# Patient Record
Sex: Male | Born: 1960 | Race: Black or African American | Hispanic: No | Marital: Single | Smoking: Current every day smoker
Health system: Southern US, Community
[De-identification: ages and names within clinical notes are randomized; demographics above are authoritative.]

## PROBLEM LIST (undated history)

## (undated) DIAGNOSIS — M5430 Sciatica, unspecified side: Secondary | ICD-10-CM

## (undated) SURGERY — INCISION AND DRAINAGE, ABSCESS
Anesthesia: Choice | Laterality: Right

---

## 2001-09-15 ENCOUNTER — Emergency Department (HOSPITAL_COMMUNITY): Admission: EM | Admit: 2001-09-15 | Discharge: 2001-09-15 | Payer: Self-pay

## 2001-09-15 ENCOUNTER — Encounter: Payer: Self-pay | Admitting: Emergency Medicine

## 2002-06-28 ENCOUNTER — Emergency Department (HOSPITAL_COMMUNITY): Admission: EM | Admit: 2002-06-28 | Discharge: 2002-06-28 | Payer: Self-pay

## 2010-01-18 ENCOUNTER — Emergency Department (HOSPITAL_COMMUNITY): Admission: EM | Admit: 2010-01-18 | Discharge: 2010-01-18 | Payer: Self-pay | Admitting: Emergency Medicine

## 2010-05-05 ENCOUNTER — Emergency Department (HOSPITAL_COMMUNITY)
Admission: EM | Admit: 2010-05-05 | Discharge: 2010-05-05 | Disposition: A | Discharge status: Home / Self Care | Payer: Self-pay | Attending: Emergency Medicine | Admitting: Emergency Medicine

## 2010-05-05 DIAGNOSIS — F319 Bipolar disorder, unspecified: Secondary | ICD-10-CM | POA: Insufficient documentation

## 2010-05-05 DIAGNOSIS — M549 Dorsalgia, unspecified: Secondary | ICD-10-CM | POA: Insufficient documentation

## 2010-05-05 DIAGNOSIS — I10 Essential (primary) hypertension: Secondary | ICD-10-CM | POA: Insufficient documentation

## 2010-05-05 DIAGNOSIS — M5430 Sciatica, unspecified side: Secondary | ICD-10-CM | POA: Insufficient documentation

## 2016-07-10 ENCOUNTER — Emergency Department (HOSPITAL_COMMUNITY)
Admission: EM | Admit: 2016-07-10 | Discharge: 2016-07-10 | Disposition: A | Discharge status: Home / Self Care | Payer: Self-pay | Attending: Emergency Medicine | Admitting: Emergency Medicine

## 2016-07-10 ENCOUNTER — Encounter (HOSPITAL_COMMUNITY): Payer: Self-pay | Admitting: Emergency Medicine

## 2016-07-10 ENCOUNTER — Emergency Department (HOSPITAL_COMMUNITY): Payer: Self-pay

## 2016-07-10 DIAGNOSIS — R05 Cough: Secondary | ICD-10-CM | POA: Insufficient documentation

## 2016-07-10 DIAGNOSIS — F17210 Nicotine dependence, cigarettes, uncomplicated: Secondary | ICD-10-CM | POA: Insufficient documentation

## 2016-07-10 DIAGNOSIS — R059 Cough, unspecified: Secondary | ICD-10-CM

## 2016-07-10 DIAGNOSIS — R0789 Other chest pain: Secondary | ICD-10-CM | POA: Insufficient documentation

## 2016-07-10 LAB — BASIC METABOLIC PANEL
Anion gap: 8 (ref 5–15)
BUN: 19 mg/dL (ref 6–20)
CO2: 25 mmol/L (ref 22–32)
Calcium: 9.1 mg/dL (ref 8.9–10.3)
Chloride: 104 mmol/L (ref 101–111)
Creatinine, Ser: 1.19 mg/dL (ref 0.61–1.24)
GFR calc non Af Amer: >60 mL/min (ref >60)
Glucose, Bld: 96 mg/dL (ref 65–99)
Potassium: 4 mmol/L (ref 3.5–5.1)
SODIUM: 137 mmol/L (ref 135–145)

## 2016-07-10 LAB — CBC
HCT: 45.8 % (ref 39.0–52.0)
Hemoglobin: 15.8 g/dL (ref 13.0–17.0)
MCH: 31 pg (ref 26.0–34.0)
MCHC: 34.5 g/dL (ref 30.0–36.0)
MCV: 89.8 fL (ref 78.0–100.0)
PLATELETS: 276 10*3/uL (ref 150–400)
RBC: 5.1 MIL/uL (ref 4.22–5.81)
RDW: 13.3 % (ref 11.5–15.5)
WBC: 6.6 10*3/uL (ref 4.0–10.5)

## 2016-07-10 LAB — I-STAT TROPONIN, ED: TROPONIN I, POC: 0 ng/mL (ref 0.00–0.08)

## 2016-07-10 MED ORDER — PREDNISONE 20 MG PO TABS: 40.0000 mg | ORAL_TABLET | Freq: Every day | ORAL | 0 refills | Status: DC

## 2016-07-10 MED ORDER — PREDNISONE 20 MG PO TABS
60.0000 mg | ORAL_TABLET | Freq: Once | ORAL | Status: AC
  Administered 2016-07-10: 60 mg via ORAL
  Filled 2016-07-10: qty 3

## 2016-07-10 MED ORDER — HYDROCOD POLST-CPM POLST ER 10-8 MG/5ML PO SUER
5.0000 mL | Freq: Once | ORAL | Status: AC
  Administered 2016-07-10: 5 mL via ORAL
  Filled 2016-07-10: qty 5

## 2016-07-10 MED ORDER — ALBUTEROL SULFATE HFA 108 (90 BASE) MCG/ACT IN AERS
2.0000 | INHALATION_SPRAY | Freq: Once | RESPIRATORY_TRACT | Status: AC
  Administered 2016-07-10: 2 via RESPIRATORY_TRACT
  Filled 2016-07-10: qty 6.7

## 2016-07-10 MED ORDER — GUAIFENESIN 100 MG/5ML PO LIQD: 200.0000 mg | Freq: Three times a day (TID) | ORAL | 0 refills | Status: DC | PRN

## 2016-07-10 NOTE — Discharge Instructions (Signed)
As discussed, your evaluation today has been largely reassuring.  But, it is important that you monitor your condition carefully, and do not hesitate to return to the ED if you develop new, or concerning changes in your condition. ? ?Otherwise, please follow-up with your physician for appropriate ongoing care. ? ?

## 2016-07-10 NOTE — ED Provider Notes (Signed)
[facility] Provider Note   CSN: [id number] Arrival date & time: 07/10/16  0915     History   Chief Complaint Chief Complaint  Patient presents with  . Chest Pain    HPI Charles Garza is a 56 y.o. male.  HPI Patient presents with concern of ongoing cough, chest pain. Patient was well until about 2 months ago, now, over the past 2 months he has had persistent discomfort, diffuse across the anterior thorax. Pain is sore, worse with coughing, but is present all the time. No clear exertional or pleuritic component. No fever, no chills. No relief with OTC medication. Patient does smoke cigarettes, acknowledges concern for one cancer, as he has multiple family members with this in the history.  History reviewed. No pertinent past medical history.  There are no active problems to display for this patient.   History reviewed. No pertinent surgical history.     Home Medications    Prior to Admission medications   Not on File    Family History No family history on file.  Social History Social History  Substance Use Topics  . Smoking status: Current Every Day Smoker  . Smokeless tobacco: Current User  . Alcohol use No     Allergies   Penicillins   Review of Systems Review of Systems  Constitutional:       Per HPI, otherwise negative  HENT:       Per HPI, otherwise negative  Respiratory:       Per HPI, otherwise negative  Cardiovascular:       Per HPI, otherwise negative  Gastrointestinal: Negative for vomiting.  Endocrine:       Negative aside from HPI  Genitourinary:       Neg aside from HPI   Musculoskeletal:       Per HPI, otherwise negative  Skin: Negative.   Neurological: Negative for syncope.     Physical Exam Updated Vital Signs BP 133/86   Pulse 72   Temp 97.9 F (36.6 C) (Oral)   Resp 17   Ht  (1.676 m)   Wt 156 lb (70.8 kg)   SpO2 100%   BMI 25.18 kg/m   Physical Exam  Constitutional: He is oriented to person,  place, and time. He appears well-developed. No distress.  HENT:  Head: Normocephalic and atraumatic.  Eyes: Conjunctivae and EOM are normal.  Cardiovascular: Normal rate and regular rhythm.   Pulmonary/Chest: Effort normal. No stridor. No respiratory distress.  Abdominal: He exhibits no distension.  Musculoskeletal: He exhibits no edema.  Neurological: He is alert and oriented to person, place, and time.  Skin: Skin is warm and dry.  Psychiatric: He has a normal mood and affect.  Nursing note and vitals reviewed.    ED Treatments / Results  Labs (all labs ordered are listed, but only abnormal results are displayed) Labs Reviewed  BASIC METABOLIC PANEL  CBC  I-STAT TROPOININ, ED    EKG  EKG Interpretation  Date/Time:  Sunday July 10 2016 09:20:12 EDT Ventricular Rate:  85 PR Interval:  142 QRS Duration: 70 QT Interval:  350 QTC Calculation: 416 R Axis:   70 Text Interpretation:  Normal sinus rhythm Right atrial enlargement q waves anteriorly Abnormal ekg Confirmed by Gerhard Munch  MD [phone]) on 07/10/2016 9:40:24 AM Also confirmed by Gerhard Munch  MD ([id number]), editor Misty Stanley [phone])  on 07/10/2016 9:49:34 AM       Radiology Dg Chest 2 View  Result Date: 07/10/2016  CLINICAL DATA:  Chest pain and shortness of breath for 3 months. EXAM: CHEST  2 VIEW COMPARISON:  None. FINDINGS: The cardiomediastinal silhouette is unremarkable. There is no evidence of focal airspace disease, pulmonary edema, suspicious pulmonary nodule/mass, pleural effusion, or pneumothorax. No acute bony abnormalities are identified. IMPRESSION: No active cardiopulmonary disease. Electronically Signed   By: Harmon Pier M.D.   On: 07/10/2016 10:50    Procedures Procedures (including critical care time)  Medications Ordered in ED Medications  chlorpheniramine-HYDROcodone (TUSSIONEX) 10-8 MG/5ML suspension 5 mL (5 mLs Oral Given 07/10/16 1056)     Initial Impression / Assessment and  Plan / ED Course  I have reviewed the triage vital signs and the nursing notes.  Pertinent labs & imaging results that were available during my care of the patient were reviewed by me and considered in my medical decision making (see chart for details).  11:02 AM On repeat exam the patient is awake and alert, in no distress per We discussed all findings with reassuring x-ray. We discussed the importance of smoking cessation, particularly given the patient's presentation today, and his family history of lung cancer. Patient is amenable to pursuing this With no evidence for pneumonia, both concern for ongoing cough course of 2 months, the patient will be started on a short course of steroids, albuterol inhaler antitussive medication  Final Clinical Impressions(s) / ED Diagnoses  Cough Chest pain   Gerhard Munch, MD 07/10/16 1103

## 2016-07-10 NOTE — ED Triage Notes (Signed)
Pt. Stated, I've had chest pain for a month, denies any other symptoms.

## 2016-07-24 ENCOUNTER — Emergency Department (HOSPITAL_COMMUNITY): Payer: Self-pay

## 2016-07-24 ENCOUNTER — Emergency Department (HOSPITAL_COMMUNITY)
Admission: EM | Admit: 2016-07-24 | Discharge: 2016-07-24 | Disposition: A | Discharge status: Home / Self Care | Payer: Self-pay | Attending: Emergency Medicine | Admitting: Emergency Medicine

## 2016-07-24 DIAGNOSIS — R0602 Shortness of breath: Secondary | ICD-10-CM

## 2016-07-24 DIAGNOSIS — Z79899 Other long term (current) drug therapy: Secondary | ICD-10-CM | POA: Insufficient documentation

## 2016-07-24 DIAGNOSIS — J40 Bronchitis, not specified as acute or chronic: Secondary | ICD-10-CM | POA: Insufficient documentation

## 2016-07-24 DIAGNOSIS — F17200 Nicotine dependence, unspecified, uncomplicated: Secondary | ICD-10-CM | POA: Insufficient documentation

## 2016-07-24 MED ORDER — ALBUTEROL SULFATE HFA 108 (90 BASE) MCG/ACT IN AERS
2.0000 | INHALATION_SPRAY | RESPIRATORY_TRACT | Status: DC
  Administered 2016-07-24: 2 via RESPIRATORY_TRACT
  Filled 2016-07-24: qty 6.7

## 2016-07-24 MED ORDER — ALBUTEROL (5 MG/ML) CONTINUOUS INHALATION SOLN
10.0000 mg/h | INHALATION_SOLUTION | Freq: Once | RESPIRATORY_TRACT | Status: AC
  Administered 2016-07-24: 10 mg/h via RESPIRATORY_TRACT
  Filled 2016-07-24: qty 20

## 2016-07-24 MED ORDER — PREDNISONE 20 MG PO TABS
60.0000 mg | ORAL_TABLET | Freq: Once | ORAL | Status: AC
  Administered 2016-07-24: 60 mg via ORAL
  Filled 2016-07-24: qty 3

## 2016-07-24 MED ORDER — PREDNISONE 10 MG (21) PO TBPK
ORAL_TABLET | Freq: Every day | ORAL | 0 refills | Status: DC
Start: 2016-07-24 — End: 2016-10-23

## 2016-07-24 NOTE — ED Provider Notes (Signed)
[facility] Provider Note   CSN: [id number] Arrival date & time: 07/24/16  1134     History   Chief Complaint Chief Complaint  Patient presents with  . Cough  . Bronchitis    HPI Charles Garza is a 56 y.o. male.  56 year old male with history of cough times several months presents with increasing nonproductive cough with some dyspnea. No fever or chills. Was diagnosed with bronchitis recently and placed on 4 days of prednisone which is completed. Denies any vomiting or diarrhea. He does use tobacco products as well as has seasonal allergies as well as works with chemicals. Symptoms seem to wax and wane and nothing makes them better or worse. He also has an albuterol inhaler which offers limited relief      No past medical history on file.  There are no active problems to display for this patient.   No past surgical history on file.     Home Medications    Prior to Admission medications   Medication Sig Start Date End Date Taking? Authorizing Provider  guaiFENesin (TUSSIN) 100 MG/5ML liquid Take 10 mLs (200 mg total) by mouth 3 (three) times daily as needed for cough. 07/10/16   Gerhard Munch, MD  predniSONE (DELTASONE) 20 MG tablet Take 2 tablets (40 mg total) by mouth daily with breakfast. For the next four days 07/10/16   Gerhard Munch, MD    Family History No family history on file.  Social History Social History  Substance Use Topics  . Smoking status: Current Every Day Smoker  . Smokeless tobacco: Current User  . Alcohol use No     Allergies   Penicillins   Review of Systems Review of Systems  All other systems reviewed and are negative.    Physical Exam Updated Vital Signs BP 121/82 (BP Location: Left Arm)   Pulse 96   Temp 98.1 F (36.7 C) (Oral)   Resp 16   Ht  (1.676 m)   Wt 70.8 kg   SpO2 98%   BMI 25.18 kg/m   Physical Exam  Constitutional: He is oriented to person, place, and time. He appears well-developed and  well-nourished.  Non-toxic appearance. No distress.  HENT:  Head: Normocephalic and atraumatic.  Eyes: Conjunctivae, EOM and lids are normal. Pupils are equal, round, and reactive to light.  Neck: Normal range of motion. Neck supple. No tracheal deviation present. No thyroid mass present.  Cardiovascular: Normal rate, regular rhythm and normal heart sounds.  Exam reveals no gallop.   No murmur heard. Pulmonary/Chest: Effort normal. No stridor. No respiratory distress. He has decreased breath sounds. He has wheezes in the right lower field and the left lower field. He has no rhonchi. He has no rales.  Abdominal: Soft. Normal appearance and bowel sounds are normal. He exhibits no distension. There is no tenderness. There is no rebound and no CVA tenderness.  Musculoskeletal: Normal range of motion. He exhibits no edema or tenderness.  Neurological: He is alert and oriented to person, place, and time. He has normal strength. No cranial nerve deficit or sensory deficit. GCS eye subscore is 4. GCS verbal subscore is 5. GCS motor subscore is 6.  Skin: Skin is warm and dry. No abrasion and no rash noted.  Psychiatric: He has a normal mood and affect. His speech is normal and behavior is normal.  Nursing note and vitals reviewed.    ED Treatments / Results  Labs (all labs ordered are listed, but only abnormal results  are displayed) Labs Reviewed - No data to display  EKG  EKG Interpretation None       Radiology Dg Chest 2 View  Result Date: 07/24/2016 CLINICAL DATA:  CP x 1 month. No relevant surgical hx. Patient is a smoker. Has high BP but not on meds. EXAM: CHEST  2 VIEW COMPARISON:  07/10/2016 FINDINGS: The heart size and mediastinal contours are within normal limits. Lungs are mildly hyperexpanded but clear. No pleural effusion or pneumothorax. The visualized skeletal structures are unremarkable. IMPRESSION: No active cardiopulmonary disease. Electronically Signed   By: Amie Portland  M.D.   On: 07/24/2016 12:53    Procedures Procedures (including critical care time)  Medications Ordered in ED Medications  albuterol (PROVENTIL,VENTOLIN) solution continuous neb (not administered)  predniSONE (DELTASONE) tablet 60 mg (not administered)     Initial Impression / Assessment and Plan / ED Course  I have reviewed the triage vital signs and the nursing notes.  Pertinent labs & imaging results that were available during my care of the patient were reviewed by me and considered in my medical decision making (see chart for details).     Patient given albuterol continuous treatment along with prednisone. His lung exam is improved. Stable for discharge  Final Clinical Impressions(s) / ED Diagnoses   Final diagnoses:  SOB (shortness of breath)    New Prescriptions New Prescriptions   No medications on file     Lorre Nick, MD 07/24/16 1455

## 2016-07-24 NOTE — ED Triage Notes (Signed)
Pt. Stated, I was here 2 weeks ago with Bronchitis and I still have the bronchitis with a cough and its green and brown.  I took 4 days of an antibiotic.

## 2016-07-24 NOTE — ED Notes (Signed)
Pt states he was seen here a couple of weeks ago and was diagnosed with bronchitis.  Pt said he was prescribed cough meds and antibiotics but nothing has helped.

## 2016-07-24 NOTE — ED Notes (Signed)
Continuous neb complete.

## 2016-10-23 ENCOUNTER — Inpatient Hospital Stay (HOSPITAL_COMMUNITY)
Admission: EM | Admit: 2016-10-23 | Discharge: 2016-10-24 | DRG: 513 | Disposition: A | Discharge status: Home / Self Care | Payer: Self-pay | Attending: Family Medicine | Admitting: Family Medicine

## 2016-10-23 ENCOUNTER — Inpatient Hospital Stay: Admit: 2016-10-23 | Payer: Self-pay | Admitting: Orthopedic Surgery

## 2016-10-23 ENCOUNTER — Encounter (HOSPITAL_COMMUNITY): Payer: Self-pay | Admitting: Emergency Medicine

## 2016-10-23 ENCOUNTER — Inpatient Hospital Stay (HOSPITAL_COMMUNITY): Payer: Self-pay | Admitting: Anesthesiology

## 2016-10-23 ENCOUNTER — Emergency Department (HOSPITAL_COMMUNITY): Payer: Self-pay

## 2016-10-23 ENCOUNTER — Encounter (HOSPITAL_COMMUNITY)
Admission: EM | Disposition: A | Discharge status: Home / Self Care | Payer: Self-pay | Source: Home / Self Care | Attending: Family Medicine

## 2016-10-23 DIAGNOSIS — E860 Dehydration: Secondary | ICD-10-CM | POA: Diagnosis present

## 2016-10-23 DIAGNOSIS — L03119 Cellulitis of unspecified part of limb: Secondary | ICD-10-CM | POA: Diagnosis present

## 2016-10-23 DIAGNOSIS — Z720 Tobacco use: Secondary | ICD-10-CM

## 2016-10-23 DIAGNOSIS — L03113 Cellulitis of right upper limb: Secondary | ICD-10-CM | POA: Diagnosis present

## 2016-10-23 DIAGNOSIS — N179 Acute kidney failure, unspecified: Secondary | ICD-10-CM | POA: Diagnosis present

## 2016-10-23 DIAGNOSIS — M65041 Abscess of tendon sheath, right hand: Principal | ICD-10-CM | POA: Diagnosis present

## 2016-10-23 DIAGNOSIS — L02511 Cutaneous abscess of right hand: Secondary | ICD-10-CM

## 2016-10-23 DIAGNOSIS — N289 Disorder of kidney and ureter, unspecified: Secondary | ICD-10-CM

## 2016-10-23 HISTORY — PX: INCISION AND DRAINAGE ABSCESS: SHX5864

## 2016-10-23 LAB — SURGICAL PCR SCREEN
MRSA, PCR: NEGATIVE
STAPHYLOCOCCUS AUREUS: NEGATIVE

## 2016-10-23 LAB — CBC WITH DIFFERENTIAL/PLATELET
BASOS ABS: 0 10*3/uL (ref 0.0–0.1)
Basophils Relative: 0 %
Eosinophils Absolute: 0.5 10*3/uL (ref 0.0–0.7)
Eosinophils Relative: 4 %
HCT: 43.1 % (ref 39.0–52.0)
Hemoglobin: 15.1 g/dL (ref 13.0–17.0)
LYMPHS ABS: 2.2 10*3/uL (ref 0.7–4.0)
Lymphocytes Relative: 19 %
MCH: 31.8 pg (ref 26.0–34.0)
MCHC: 35 g/dL (ref 30.0–36.0)
MCV: 90.7 fL (ref 78.0–100.0)
MONO ABS: 1.1 10*3/uL — AB (ref 0.1–1.0)
Monocytes Relative: 9 %
Neutro Abs: 8 10*3/uL — ABNORMAL HIGH (ref 1.7–7.7)
Neutrophils Relative %: 68 %
PLATELETS: 253 10*3/uL (ref 150–400)
RBC: 4.75 MIL/uL (ref 4.22–5.81)
RDW: 12.9 % (ref 11.5–15.5)
WBC: 11.8 10*3/uL — AB (ref 4.0–10.5)

## 2016-10-23 LAB — BASIC METABOLIC PANEL
Anion gap: 8 (ref 5–15)
BUN: 11 mg/dL (ref 6–20)
CO2: 27 mmol/L (ref 22–32)
CREATININE: 1.29 mg/dL — AB (ref 0.61–1.24)
Calcium: 9.1 mg/dL (ref 8.9–10.3)
Chloride: 101 mmol/L (ref 101–111)
GFR calc Af Amer: >60 mL/min (ref >60)
Glucose, Bld: 107 mg/dL — ABNORMAL HIGH (ref 65–99)
Potassium: 3.9 mmol/L (ref 3.5–5.1)
SODIUM: 136 mmol/L (ref 135–145)

## 2016-10-23 LAB — LACTIC ACID, PLASMA: LACTIC ACID, VENOUS: 0.8 mmol/L (ref 0.5–1.9)

## 2016-10-23 LAB — GLUCOSE, CAPILLARY: GLUCOSE-CAPILLARY: 98 mg/dL (ref 65–99)

## 2016-10-23 LAB — HIV ANTIBODY (ROUTINE TESTING W REFLEX): HIV SCREEN 4TH GENERATION: NONREACTIVE

## 2016-10-23 SURGERY — INCISION AND DRAINAGE, ABSCESS
Anesthesia: General | Site: Arm Lower | Laterality: Right

## 2016-10-23 MED ORDER — LIDOCAINE HCL (CARDIAC) 20 MG/ML IV SOLN
INTRAVENOUS | Status: DC | PRN
  Administered 2016-10-23: 60 mg via INTRAVENOUS

## 2016-10-23 MED ORDER — MORPHINE SULFATE (PF) 4 MG/ML IV SOLN
2.0000 mg | INTRAVENOUS | Status: DC | PRN
  Administered 2016-10-23 – 2016-10-24 (×2): 4 mg via INTRAVENOUS
  Filled 2016-10-23 (×2): qty 1

## 2016-10-23 MED ORDER — FENTANYL CITRATE (PF) 100 MCG/2ML IJ SOLN
100.0000 ug | Freq: Once | INTRAMUSCULAR | Status: AC
  Administered 2016-10-23: 100 ug via INTRAVENOUS
  Filled 2016-10-23: qty 2

## 2016-10-23 MED ORDER — HYDROMORPHONE HCL 1 MG/ML IJ SOLN
INTRAMUSCULAR | Status: AC
  Administered 2016-10-23: 0.5 mg via INTRAVENOUS
  Filled 2016-10-23: qty 1

## 2016-10-23 MED ORDER — ACETAMINOPHEN 325 MG PO TABS: 650.0000 mg | ORAL_TABLET | Freq: Four times a day (QID) | ORAL | Status: DC | PRN

## 2016-10-23 MED ORDER — MIDAZOLAM HCL 2 MG/2ML IJ SOLN
INTRAMUSCULAR | Status: AC
  Filled 2016-10-23: qty 2

## 2016-10-23 MED ORDER — DOXYCYCLINE HYCLATE 100 MG PO TABS
100.0000 mg | ORAL_TABLET | Freq: Two times a day (BID) | ORAL | Status: DC
  Administered 2016-10-23 – 2016-10-24 (×3): 100 mg via ORAL
  Filled 2016-10-23 (×3): qty 1

## 2016-10-23 MED ORDER — HYDROCODONE-ACETAMINOPHEN 5-325 MG PO TABS
1.0000 | ORAL_TABLET | ORAL | Status: DC | PRN
  Administered 2016-10-23 (×2): 1 via ORAL
  Administered 2016-10-24 (×2): 2 via ORAL
  Filled 2016-10-23 (×2): qty 1
  Filled 2016-10-23 (×2): qty 2

## 2016-10-23 MED ORDER — FENTANYL CITRATE (PF) 100 MCG/2ML IJ SOLN
50.0000 ug | Freq: Once | INTRAMUSCULAR | Status: AC
  Administered 2016-10-23: 50 ug via INTRAVENOUS
  Filled 2016-10-23: qty 2

## 2016-10-23 MED ORDER — LACTATED RINGERS IV SOLN
INTRAVENOUS | Status: DC | PRN
  Administered 2016-10-23: 10:00:00 via INTRAVENOUS

## 2016-10-23 MED ORDER — BISACODYL 5 MG PO TBEC: 5.0000 mg | DELAYED_RELEASE_TABLET | Freq: Every day | ORAL | Status: DC | PRN

## 2016-10-23 MED ORDER — CHLORHEXIDINE GLUCONATE 4 % EX LIQD
60.0000 mL | Freq: Once | CUTANEOUS | Status: DC
  Administered 2016-10-23: 4 via TOPICAL

## 2016-10-23 MED ORDER — TETANUS-DIPHTH-ACELL PERTUSSIS 5-2.5-18.5 LF-MCG/0.5 IM SUSP
0.5000 mL | Freq: Once | INTRAMUSCULAR | Status: DC
  Filled 2016-10-23: qty 0.5

## 2016-10-23 MED ORDER — FENTANYL CITRATE (PF) 250 MCG/5ML IJ SOLN
INTRAMUSCULAR | Status: AC
  Filled 2016-10-23: qty 5

## 2016-10-23 MED ORDER — CLINDAMYCIN PHOSPHATE 600 MG/50ML IV SOLN
600.0000 mg | Freq: Once | INTRAVENOUS | Status: AC
  Administered 2016-10-23: 600 mg via INTRAVENOUS
  Filled 2016-10-23: qty 50

## 2016-10-23 MED ORDER — ACETAMINOPHEN 650 MG RE SUPP: 650.0000 mg | Freq: Four times a day (QID) | RECTAL | Status: DC | PRN

## 2016-10-23 MED ORDER — BUPIVACAINE HCL (PF) 0.25 % IJ SOLN
INTRAMUSCULAR | Status: AC
  Filled 2016-10-23: qty 30

## 2016-10-23 MED ORDER — SODIUM CHLORIDE 0.9 % IR SOLN
Status: DC | PRN
  Administered 2016-10-23: 1000 mL

## 2016-10-23 MED ORDER — PROPOFOL 10 MG/ML IV BOLUS
INTRAVENOUS | Status: DC | PRN
  Administered 2016-10-23: 150 mg via INTRAVENOUS

## 2016-10-23 MED ORDER — SUCCINYLCHOLINE CHLORIDE 200 MG/10ML IV SOSY
PREFILLED_SYRINGE | INTRAVENOUS | Status: AC
  Filled 2016-10-23: qty 10

## 2016-10-23 MED ORDER — SENNOSIDES-DOCUSATE SODIUM 8.6-50 MG PO TABS: 1.0000 | ORAL_TABLET | Freq: Every evening | ORAL | Status: DC | PRN

## 2016-10-23 MED ORDER — CLINDAMYCIN PHOSPHATE 600 MG/50ML IV SOLN: 600.0000 mg | Freq: Three times a day (TID) | INTRAVENOUS | Status: DC

## 2016-10-23 MED ORDER — MIDAZOLAM HCL 5 MG/5ML IJ SOLN
INTRAMUSCULAR | Status: DC | PRN
  Administered 2016-10-23: 2 mg via INTRAVENOUS

## 2016-10-23 MED ORDER — VANCOMYCIN HCL 10 G IV SOLR
1500.0000 mg | Freq: Once | INTRAVENOUS | Status: AC
  Administered 2016-10-23: 1500 mg via INTRAVENOUS
  Filled 2016-10-23: qty 1500

## 2016-10-23 MED ORDER — PROPOFOL 10 MG/ML IV BOLUS
INTRAVENOUS | Status: AC
  Filled 2016-10-23: qty 20

## 2016-10-23 MED ORDER — LIDOCAINE 2% (20 MG/ML) 5 ML SYRINGE
INTRAMUSCULAR | Status: AC
  Filled 2016-10-23: qty 5

## 2016-10-23 MED ORDER — FENTANYL CITRATE (PF) 100 MCG/2ML IJ SOLN
INTRAMUSCULAR | Status: DC | PRN
  Administered 2016-10-23 (×4): 50 ug via INTRAVENOUS

## 2016-10-23 MED ORDER — SODIUM CHLORIDE 0.9 % IV SOLN
INTRAVENOUS | Status: AC
  Administered 2016-10-23: 06:00:00 via INTRAVENOUS

## 2016-10-23 MED ORDER — BUPIVACAINE HCL (PF) 0.25 % IJ SOLN
INTRAMUSCULAR | Status: DC | PRN
  Administered 2016-10-23: 30 mL

## 2016-10-23 MED ORDER — HYDROMORPHONE HCL 1 MG/ML IJ SOLN
0.2500 mg | INTRAMUSCULAR | Status: DC | PRN
  Administered 2016-10-23 (×2): 0.5 mg via INTRAVENOUS

## 2016-10-23 SURGICAL SUPPLY — 58 items
BANDAGE ACE 3X5.8 VEL STRL LF (GAUZE/BANDAGES/DRESSINGS) ×3 IMPLANT
BANDAGE ACE 4X5 VEL STRL LF (GAUZE/BANDAGES/DRESSINGS) ×3 IMPLANT
BANDAGE ELASTIC 3 VELCRO ST LF (GAUZE/BANDAGES/DRESSINGS) ×2 IMPLANT
BNDG CMPR 9X4 STRL LF SNTH (GAUZE/BANDAGES/DRESSINGS)
BNDG CONFORM 2 STRL LF (GAUZE/BANDAGES/DRESSINGS) IMPLANT
BNDG ESMARK 4X9 LF (GAUZE/BANDAGES/DRESSINGS) IMPLANT
BNDG GAUZE ELAST 4 BULKY (GAUZE/BANDAGES/DRESSINGS) ×6 IMPLANT
CORDS BIPOLAR (ELECTRODE) IMPLANT
COVER SURGICAL LIGHT HANDLE (MISCELLANEOUS) ×6 IMPLANT
CUFF TOURNIQUET SINGLE 18IN (TOURNIQUET CUFF) ×3 IMPLANT
DRAIN PENROSE 1/4X12 LTX STRL (WOUND CARE) IMPLANT
DRAPE OEC MINIVIEW 54X84 (DRAPES) IMPLANT
DRAPE SURG 17X23 STRL (DRAPES) ×3 IMPLANT
DRSG PAD ABDOMINAL 8X10 ST (GAUZE/BANDAGES/DRESSINGS) ×6 IMPLANT
DURAPREP 26ML APPLICATOR (WOUND CARE) IMPLANT
ELECT REM PT RETURN 9FT ADLT (ELECTROSURGICAL)
ELECTRODE REM PT RTRN 9FT ADLT (ELECTROSURGICAL) IMPLANT
GAUZE PACKING IODOFORM 1/4X5 (PACKING) IMPLANT
GAUZE SPONGE 4X4 12PLY STRL (GAUZE/BANDAGES/DRESSINGS) ×6 IMPLANT
GAUZE SPONGE 4X4 16PLY XRAY LF (GAUZE/BANDAGES/DRESSINGS) ×2 IMPLANT
GAUZE XEROFORM 1X8 LF (GAUZE/BANDAGES/DRESSINGS) ×3 IMPLANT
GLOVE SURG SYN 8.0 (GLOVE) ×3 IMPLANT
GLOVE SURG SYN 8.0 PF PI (GLOVE) ×1 IMPLANT
GOWN STRL REUS W/ TWL LRG LVL3 (GOWN DISPOSABLE) ×1 IMPLANT
GOWN STRL REUS W/ TWL XL LVL3 (GOWN DISPOSABLE) ×1 IMPLANT
GOWN STRL REUS W/TWL LRG LVL3 (GOWN DISPOSABLE) ×3
GOWN STRL REUS W/TWL XL LVL3 (GOWN DISPOSABLE) ×3
HANDPIECE INTERPULSE COAX TIP (DISPOSABLE)
KIT BASIN OR (CUSTOM PROCEDURE TRAY) ×3 IMPLANT
KIT ROOM TURNOVER OR (KITS) ×3 IMPLANT
MANIFOLD NEPTUNE II (INSTRUMENTS) ×3 IMPLANT
NDL HYPO 25GX1X1/2 BEV (NEEDLE) IMPLANT
NDL HYPO 25X1 1.5 SAFETY (NEEDLE) ×1 IMPLANT
NEEDLE HYPO 25GX1X1/2 BEV (NEEDLE) IMPLANT
NEEDLE HYPO 25X1 1.5 SAFETY (NEEDLE) ×3 IMPLANT
NS IRRIG 1000ML POUR BTL (IV SOLUTION) ×3 IMPLANT
PACK ORTHO EXTREMITY (CUSTOM PROCEDURE TRAY) ×3 IMPLANT
PAD ARMBOARD 7.5X6 YLW CONV (MISCELLANEOUS) ×6 IMPLANT
PAD CAST 4YDX4 CTTN HI CHSV (CAST SUPPLIES) ×2 IMPLANT
PADDING CAST COTTON 4X4 STRL (CAST SUPPLIES) ×6
PADDING CAST SYNTHETIC 3 NS LF (CAST SUPPLIES) ×2
PADDING CAST SYNTHETIC 3X4 NS (CAST SUPPLIES) IMPLANT
PADDING CAST SYNTHETIC 4 (CAST SUPPLIES) ×2
PADDING CAST SYNTHETIC 4X4 STR (CAST SUPPLIES) IMPLANT
SET HNDPC FAN SPRY TIP SCT (DISPOSABLE) IMPLANT
SPONGE LAP 18X18 X RAY DECT (DISPOSABLE) ×3 IMPLANT
SUCTION FRAZIER HANDLE 10FR (MISCELLANEOUS) ×2
SUCTION TUBE FRAZIER 10FR DISP (MISCELLANEOUS) ×1 IMPLANT
SUT VICRYL RAPIDE 4/0 PS 2 (SUTURE) IMPLANT
SWAB CULTURE ESWAB REG 1ML (MISCELLANEOUS) IMPLANT
SYR 20CC LL (SYRINGE) IMPLANT
SYR CONTROL 10ML LL (SYRINGE) ×3 IMPLANT
TOWEL OR 17X24 6PK STRL BLUE (TOWEL DISPOSABLE) ×3 IMPLANT
TOWEL OR 17X26 10 PK STRL BLUE (TOWEL DISPOSABLE) ×3 IMPLANT
TUBE CONNECTING 12'X1/4 (SUCTIONS) ×1
TUBE CONNECTING 12X1/4 (SUCTIONS) ×2 IMPLANT
UNDERPAD 30X30 (UNDERPADS AND DIAPERS) ×3 IMPLANT
YANKAUER SUCT BULB TIP NO VENT (SUCTIONS) ×3 IMPLANT

## 2016-10-23 NOTE — ED Notes (Signed)
Pt outside when RN went to get him for room.

## 2016-10-23 NOTE — ED Triage Notes (Signed)
Pt st's he hit his right hand on a pressure washer 4 days ago.  Now right hand swollen, red and painful

## 2016-10-23 NOTE — H&P (Signed)
History and Physical    Charles Garza ZOX:[id number] DOB: 01-12-1961 DOA: 10/23/2016  PCP: Patient, No Pcp Per   Patient coming from: Home  Chief Complaint: Right hand pain, swelling, drainage with foul odor   HPI: Charles Garza is a generally healthy 56 y.o. male with no known past medical history, now presenting to the emergency department for evaluation of pain, swelling, redness, and drainage with foul odor from the right hand. Patient reports that he was in his usual state of health when he struck his right hand on a piece of machinery at work on 10/19/2016 while trying to swat at a bee. He denies any significant pain at that time, but swelling and pain developed the following day. Since that time, there has been progressive swelling centered around the second MCP with erythema and increasing pain. He reports that yesterday, spontaneous drainage was noted from the radial aspect of second MCP with a foul odor. He denies any history diabetes mellitus, denies history of skin or soft tissue infections, and denies any fevers or chills.  ED Course: Upon arrival to the ED, patient is found to be afebrile, saturating well on room air, and with vital signs otherwise stable. Chemistry panels notable for serum creatinine 1.29, up from 1.19 previously. CBC features a leukocytosis to 11,800. Radiograph of the right hand demonstrates soft tissue swelling over the right hand without any acute bony abnormality. Patient was treated with fentanyl in the ED, empiric clindamycin, and his Tdap was updated. And surgery was consulted by the ED physician and advised for a medical admission with IV antibiotics and plan for surgery this morning. Patient has remained hemodynamically stable in the emergency department, has not been in any apparent respiratory distress, and will be admitted to medical/surgical unit for ongoing evaluation and management of right hand cellulitis with abscess.  Review of Systems:  All other  systems reviewed and apart from HPI, are negative.  History reviewed. No pertinent past medical history.  History reviewed. No pertinent surgical history.   reports that he has quit smoking. He uses smokeless tobacco. He reports that he does not drink alcohol or use drugs.  Allergies  Allergen Reactions  . Penicillins Nausea And Vomiting and Other (See Comments)    Sick, dizziness.    Family History  Problem Relation Age of Onset  . CAD Father   . Lung cancer Sister      Prior to Admission medications   Not on File    Physical Exam: Vitals:   10/23/16 0222 10/23/16 0330  BP: (!) 137/92 (!) 148/82  Pulse: 98 74  Resp: 18   Temp: 98 F (36.7 C)   TempSrc: Oral   SpO2: 97% 100%  Weight: 70.8 kg (156 lb)   Height: 5\' 6"  (1.676 m)       Constitutional: NAD, calm, in apparent discomfort. Eyes: PERTLA, lids and conjunctivae normal ENMT: Mucous membranes are moist. Posterior pharynx clear of any exudate or lesions.   Neck: normal, supple, no masses, no thyromegaly Respiratory: clear to auscultation bilaterally, no wheezing, no crackles. Normal respiratory effort.  Cardiovascular: S1 & S2 heard, regular rate and rhythm. No extremity edema. No significant JVD. Abdomen: No distension, no tenderness, no masses palpated. Bowel sounds normal.  Musculoskeletal: no clubbing / cyanosis. No joint deformity upper and lower extremities.   Skin: Redness, swelling, and exquisite tenderness to right hand, centered around 2nd MCP where there is a fluctuant area with bloody drainage from the radial aspect. Skin is otherwise  warm, dry, well-perfused. Neurologic: CN 2-12 grossly intact. Sensation intact, DTR normal. Strength 5/5 in all 4 limbs.  Psychiatric: Alert and oriented x 3. Calm, cooperative.      Labs on Admission: I have personally reviewed following labs and imaging studies  CBC:  Recent Labs Lab 10/23/16 0310  WBC 11.8*  NEUTROABS 8.0*  HGB 15.1  HCT 43.1  MCV 90.7    PLT 253   Basic Metabolic Panel:  Recent Labs Lab 10/23/16 0310  NA 136  K 3.9  CL 101  CO2 27  GLUCOSE 107*  BUN 11  CREATININE 1.29*  CALCIUM 9.1   GFR: Estimated Creatinine Clearance: 58.4 mL/min (A) (by C-G formula based on SCr of 1.29 mg/dL (H)). Liver Function Tests: No results for input(s): AST, ALT, ALKPHOS, BILITOT, PROT, ALBUMIN in the last 168 hours. No results for input(s): LIPASE, AMYLASE in the last 168 hours. No results for input(s): AMMONIA in the last 168 hours. Coagulation Profile: No results for input(s): INR, PROTIME in the last 168 hours. Cardiac Enzymes: No results for input(s): CKTOTAL, CKMB, CKMBINDEX, TROPONINI in the last 168 hours. BNP (last 3 results) No results for input(s): PROBNP in the last 8760 hours. HbA1C: No results for input(s): HGBA1C in the last 72 hours. CBG: No results for input(s): GLUCAP in the last 168 hours. Lipid Profile: No results for input(s): CHOL, HDL, LDLCALC, TRIG, CHOLHDL, LDLDIRECT in the last 72 hours. Thyroid Function Tests: No results for input(s): TSH, T4TOTAL, FREET4, T3FREE, THYROIDAB in the last 72 hours. Anemia Panel: No results for input(s): VITAMINB12, FOLATE, FERRITIN, TIBC, IRON, RETICCTPCT in the last 72 hours. Urine analysis: No results found for: COLORURINE, APPEARANCEUR, LABSPEC, PHURINE, GLUCOSEU, HGBUR, BILIRUBINUR, KETONESUR, PROTEINUR, UROBILINOGEN, NITRITE, LEUKOCYTESUR Sepsis Labs: @LABRCNTIP (procalcitonin:4,lacticidven:4) )No results found for this or any previous visit (from the past 240 hour(s)).   Radiological Exams on Admission: Dg Hand Complete Right  Result Date: 10/23/2016 CLINICAL DATA:  Injury to the right hand 2 days ago. Small laceration at the second MCP joint. Right hand swelling and unable to straighten fingers completely. EXAM: RIGHT HAND - COMPLETE 3+ VIEW COMPARISON:  None. FINDINGS: Dorsal soft tissue swelling over the right hand. No radiopaque soft tissue foreign bodies or  gas collections identified. No evidence of acute fracture or dislocation. No focal bone lesions or bone destruction. IMPRESSION: Soft tissue swelling over the right hand. No acute bony abnormalities. Electronically Signed   By: Burman NievesWilliam  Stevens M.D.   On: 10/23/2016 03:35    EKG: Not performed.   Assessment/Plan  1. Cellulitis and abscess, right hand  - Pt suffered a minor injury to the right hand on 10/19/16  - Pain and swelling was noted the following day, and has progressed  - Now presents with marked swelling to the right hand with an apparent abscess at the radiopalmar aspect of 2nd MCP with spontaneous drainage  - Plain radiographs negative for underlying bony abnormality  - Leukocytosis noted on admission  - In the ED, he was provided pain-control, Tdap, and empiric Clinda  - Hand surgery is consulting and much appreciated, planning for OR this am  - Continue empiric abx with vancomycin, continue supportive care with IVF hydration, prn analgesia   2. Renal insufficiency, mild  - SCr is 1.29 on admission, up slightly from prior - He is being hydrated with NS  - Chem panel will be repeated tomorrow    DVT prophylaxis: SCD's  Code Status: Full  Family Communication: Discussed with patient Disposition Plan:  Admit to med-surg Consults called: Hand surgery Admission status: Inpatient   Briscoe Deutscherimothy S Holston Oyama, MD [facility] Pager [phone]  If 7PM-7AM, please contact night-coverage [email or web address] Password [email or web address]  10/23/2016, 5:58 AM

## 2016-10-23 NOTE — Op Note (Signed)
Charles Garza:  Charles Garza, Charles Garza               ACCOUNT NO.:  [id number]  MEDICAL RECORD NO.:  [id number]  LOCATION:                               FACILITY:  [facility]  PHYSICIAN:  Artist PaisMatthew A. Danyael Alipio, M.D.DATE OF BIRTH:  [birth year]  DATE OF PROCEDURE:  10/23/2016 DATE OF DISCHARGE:  10/24/2016                              OPERATIVE REPORT   PREOPERATIVE DIAGNOSIS:  Right index finger and right palmar infections of the flexor sheath and palmar space.  POSTOPERATIVE DIAGNOSIS:  Right index finger and right palmar infections of the flexor sheath and palmar space.  PROCEDURE:  Incision and drainage of flexor sheath, right index finger and palmar abscess.  SURGEON:  Artist PaisMatthew A. Mina MarbleWeingold, M.D.  ASSISTANT:  None.  ANESTHESIA:  General.  COMPLICATION:  None.  DRAINS:  None.  Wound packed open.  Cultures sent.  DESCRIPTION OF PROCEDURE:  The patient was taken to the operating suite. After induction of adequate general anesthetic, right upper extremity was prepped and draped in usual sterile fashion.  An Esmarch was used to exsanguinate the limb.  Tourniquet was inflated to 250 mmHg.  At this point in time, an open wound measuring 1 x 1 cm over the radial side of the index finger metacarpophalangeal joint was extended in midlateral fashion for 3-4 cm.  We identified the radial neurovascular bundle and retracted.  There was a large palmar space abscess over the A1 pulley area that was drained as well as involvement of the flexor sheath of the index finger.  Both these were opened and then thoroughly irrigated with 3 L of normal saline using cystoscopy tubing.  Prior to this, cultures were sent for aerobic, anaerobic, and Gram stain.  After this was completed, the wound was packed open with quarter-inch iodoform gauze soaked in 0.25% plain Marcaine.  We then dressed with 4x4s, fluffs, and a compression bandage.  The patient tolerated this procedure well, went to recovery room in stable  fashion.     Artist PaisMatthew A. Mina MarbleWeingold, M.D.     MAW/MEDQ  D:  10/23/2016  T:  10/23/2016  Job:  [id number]

## 2016-10-23 NOTE — Consult Note (Signed)
Reason for Consult: Right hand infection Referring Physician: Elza Rafter line  Charles Garza is an 56 y.o. male.  HPI: Patient's a very pleasant 56 year old right-hand-dominant male who injured himself this past Wednesday while using a pressure washer who presented with worsening pain and swelling along the radial side of the right index finger metacarpophalangeal joint with evidence of infection  History reviewed. No pertinent past medical history.  History reviewed. No pertinent surgical history.  Family History  Problem Relation Age of Onset  . CAD Father   . Lung cancer Sister     Social History:  reports that he has quit smoking. He uses smokeless tobacco. He reports that he does not drink alcohol or use drugs.  Allergies:  Allergies  Allergen Reactions  . Penicillins Nausea And Vomiting and Other (See Comments)    Sick, dizziness.    Medications:  Scheduled: . chlorhexidine  60 mL Topical Once  . [MAR Hold] Tdap  0.5 mL Intramuscular Once    Results for orders placed or performed during the hospital encounter of 10/23/16 (from the past 48 hour(s))  Basic metabolic panel     Status: Abnormal   Collection Time: 10/23/16  3:10 AM  Result Value Ref Range   Sodium 136 135 - 145 mmol/L   Potassium 3.9 3.5 - 5.1 mmol/L   Chloride 101 101 - 111 mmol/L   CO2 27 22 - 32 mmol/L   Glucose, Bld 107 (H) 65 - 99 mg/dL   BUN 11 6 - 20 mg/dL   Creatinine, Ser 1.29 (H) 0.61 - 1.24 mg/dL   Calcium 9.1 8.9 - 10.3 mg/dL   GFR calc non Af Amer >60 >60 mL/min   GFR calc Af Amer >60 >60 mL/min    Comment: (NOTE) The eGFR has been calculated using the CKD EPI equation. This calculation has not been validated in all clinical situations. eGFR's persistently <60 mL/min signify possible Chronic Kidney Disease.    Anion gap 8 5 - 15  CBC with Differential/Platelet     Status: Abnormal   Collection Time: 10/23/16  3:10 AM  Result Value Ref Range   WBC 11.8 (H) 4.0 - 10.5 K/uL   RBC 4.75 4.22  - 5.81 MIL/uL   Hemoglobin 15.1 13.0 - 17.0 g/dL   HCT 43.1 39.0 - 52.0 %   MCV 90.7 78.0 - 100.0 fL   MCH 31.8 26.0 - 34.0 pg   MCHC 35.0 30.0 - 36.0 g/dL   RDW 12.9 11.5 - 15.5 %   Platelets 253 150 - 400 K/uL   Neutrophils Relative % 68 %   Lymphocytes Relative 19 %   Monocytes Relative 9 %   Eosinophils Relative 4 %   Basophils Relative 0 %   Neutro Abs 8.0 (H) 1.7 - 7.7 K/uL   Lymphs Abs 2.2 0.7 - 4.0 K/uL   Monocytes Absolute 1.1 (H) 0.1 - 1.0 K/uL   Eosinophils Absolute 0.5 0.0 - 0.7 K/uL   Basophils Absolute 0.0 0.0 - 0.1 K/uL   WBC Morphology ATYPICAL LYMPHOCYTES   Lactic acid, plasma     Status: None   Collection Time: 10/23/16  6:14 AM  Result Value Ref Range   Lactic Acid, Venous 0.8 0.5 - 1.9 mmol/L  Glucose, capillary     Status: None   Collection Time: 10/23/16  8:00 AM  Result Value Ref Range   Glucose-Capillary 98 65 - 99 mg/dL   Comment 1 Notify RN     Dg Hand Complete Right  Result Date: 10/23/2016 CLINICAL DATA:  Injury to the right hand 2 days ago. Small laceration at the second MCP joint. Right hand swelling and unable to straighten fingers completely. EXAM: RIGHT HAND - COMPLETE 3+ VIEW COMPARISON:  None. FINDINGS: Dorsal soft tissue swelling over the right hand. No radiopaque soft tissue foreign bodies or gas collections identified. No evidence of acute fracture or dislocation. No focal bone lesions or bone destruction. IMPRESSION: Soft tissue swelling over the right hand. No acute bony abnormalities. Electronically Signed   By: Lucienne Capers M.D.   On: 10/23/2016 03:35    Review of Systems  All other systems reviewed and are negative.  Blood pressure 117/67, pulse 67, temperature 98.4 F (36.9 C), temperature source Oral, resp. rate 18, height 5' 6"  (1.676 m), weight 70.8 kg (156 lb 1.4 oz), SpO2 98 %. Physical Exam  Constitutional: He is oriented to person, place, and time. He appears well-developed and well-nourished.  HENT:  Head:  Normocephalic and atraumatic.  Neck: Normal range of motion.  Cardiovascular: Normal rate.   Respiratory: Effort normal.  Musculoskeletal:       Right hand: He exhibits tenderness, laceration and swelling.  Right hand global dorsal swelling and erythema with 1 x 2 cm open wound along the radial side of right index finger metacarpophalangeal joint.  Neurological: He is alert and oriented to person, place, and time.  Skin: Skin is warm. There is erythema.  Psychiatric: He has a normal mood and affect. His behavior is normal. Judgment and thought content normal.    Assessment/Plan: Have discussed current predicament with patient. Recommend admission to the medical service for intravenous antibiotics. We will perform incision and drainage this morning with cultures. Have discussed with patient need for postoperative wound care and healing with secondary intention  Schuyler Amor 10/23/2016, 8:52 AM

## 2016-10-23 NOTE — Anesthesia Preprocedure Evaluation (Addendum)
Anesthesia Evaluation  Patient identified by MRN, date of birth, ID band Patient awake    Reviewed: Allergy & Precautions, H&P , NPO status , Patient's Chart, lab work & pertinent test results  Airway Mallampati: II  TM Distance: >3 FB Neck ROM: Full    Dental no notable dental hx. (+) Poor Dentition, Dental Advisory Given   Pulmonary neg pulmonary ROS, former smoker,    Pulmonary exam normal breath sounds clear to auscultation       Cardiovascular negative cardio ROS   Rhythm:Regular Rate:Normal     Neuro/Psych negative neurological ROS  negative psych ROS   GI/Hepatic negative GI ROS, Neg liver ROS,   Endo/Other  negative endocrine ROS  Renal/GU negative Renal ROS  negative genitourinary   Musculoskeletal   Abdominal   Peds  Hematology negative hematology ROS (+)   Anesthesia Other Findings   Reproductive/Obstetrics negative OB ROS                            Anesthesia Physical Anesthesia Plan  ASA: I  Anesthesia Plan: General   Post-op Pain Management:    Induction: Intravenous  PONV Risk Score and Plan: 3 and Ondansetron, Dexamethasone and Midazolam  Airway Management Planned: LMA  Additional Equipment:   Intra-op Plan:   Post-operative Plan: Extubation in OR  Informed Consent: I have reviewed the patients History and Physical, chart, labs and discussed the procedure including the risks, benefits and alternatives for the proposed anesthesia with the patient or authorized representative who has indicated his/her understanding and acceptance.   Dental advisory given  Plan Discussed with: CRNA  Anesthesia Plan Comments:         Anesthesia Quick Evaluation

## 2016-10-23 NOTE — Op Note (Deleted)
  The note originally documented on this encounter has been moved the the encounter in which it belongs.  

## 2016-10-23 NOTE — ED Notes (Signed)
TO XRAY at this time

## 2016-10-23 NOTE — Anesthesia Procedure Notes (Signed)
Procedure Name: LMA Insertion Date/Time: 10/23/2016 10:12 AM Performed by: Coralee RudFLORES, Vidya Bamford Pre-anesthesia Checklist: Patient identified, Emergency Drugs available and Suction available Patient Re-evaluated:Patient Re-evaluated prior to induction Oxygen Delivery Method: Circle system utilized Preoxygenation: Pre-oxygenation with 100% oxygen Induction Type: IV induction Ventilation: Mask ventilation without difficulty LMA: LMA inserted LMA Size: 5.0 Number of attempts: 1 Placement Confirmation: positive ETCO2 and breath sounds checked- equal and bilateral

## 2016-10-23 NOTE — Progress Notes (Signed)
Patient underwent incision and drainage of right index finger palmar abscess and flexor sheath infection. Patient had intraoperative cultures and wound packed open. Would recommend discharge patient tomorrow or Tuesday when white count normalizes. Would recommend discharge on oral antibiotics to cover Gram stain results. We will need to see my office on Tuesday to have his packing removed and start whirlpool and dressing changes.

## 2016-10-23 NOTE — Progress Notes (Signed)
Pharmacy Antibiotic Note  Charles Garza is a 56 y.o. male admitted on 10/23/2016 with cellulitis.  Pharmacy has been consulted for vancomycin dosing.  Plan: Vancomycin 1500mg  x1 then 1250mg  IV every 24 hours.  Goal trough 10-15 mcg/mL.  Height: 5\' 6"  (167.6 cm) Weight: 156 lb (70.8 kg) IBW/kg (Calculated) : 63.8  Temp (24hrs), Avg:98 F (36.7 C), Min:98 F (36.7 C), Max:98 F (36.7 C)   Recent Labs Lab 10/23/16 0310  WBC 11.8*  CREATININE 1.29*    Estimated Creatinine Clearance: 58.4 mL/min (A) (by C-G formula based on SCr of 1.29 mg/dL (H)).    Allergies  Allergen Reactions  . Penicillins Nausea And Vomiting and Other (See Comments)    Sick, dizziness.    Thank you for allowing pharmacy to be a part of this patient's care.  Charles Garza, PharmD, BCPS  10/23/2016 6:02 AM

## 2016-10-23 NOTE — ED Notes (Signed)
Taken to xray at this time. 

## 2016-10-23 NOTE — ED Notes (Signed)
Attempted report at this time without success.  Nurse to call back. 

## 2016-10-23 NOTE — Transfer of Care (Signed)
Immediate Anesthesia Transfer of Care Note  Patient: Charles Garza  Procedure(s) Performed: Procedure(s): INCISION AND DRAINAGE, RIGHT HAND (Right)  Patient Location: PACU  Anesthesia Type:General  Level of Consciousness: awake, alert  and patient cooperative  Airway & Oxygen Therapy: Patient Spontanous Breathing  Post-op Assessment: Report given to RN, Post -op Vital signs reviewed and stable and Patient moving all extremities  Post vital signs: Reviewed and stable  Last Vitals:  Vitals:   10/23/16 0642 10/23/16 0752  BP:  117/67  Pulse:  67  Resp:  18  Temp: 37.1 C 36.9 C    Last Pain:  Vitals:   10/23/16 0752  TempSrc: Oral  PainSc:          Complications: No apparent anesthesia complications

## 2016-10-23 NOTE — Op Note (Signed)
Operative report #[id number]

## 2016-10-23 NOTE — Anesthesia Postprocedure Evaluation (Signed)
Anesthesia Post Note  Patient: Charles JonesLester Garza  Procedure(s) Performed: Procedure(s) (LRB): INCISION AND DRAINAGE, RIGHT HAND (Right)     Patient location during evaluation: PACU Anesthesia Type: General Level of consciousness: awake and alert Pain management: pain level controlled Vital Signs Assessment: post-procedure vital signs reviewed and stable Respiratory status: spontaneous breathing, nonlabored ventilation and respiratory function stable Cardiovascular status: blood pressure returned to baseline and stable Postop Assessment: no signs of nausea or vomiting Anesthetic complications: no    Last Vitals:  Vitals:   10/23/16 1145 10/23/16 1212  BP: (!) 144/73 125/80  Pulse: 68 74  Resp: 12 13  Temp: (!) 36.4 C 37.1 C    Last Pain:  Vitals:   10/23/16 1212  TempSrc: Oral  PainSc:                  Roselyn Doby,W. EDMOND

## 2016-10-23 NOTE — ED Provider Notes (Signed)
[facility]-EMERGENCY DEPT Provider Note   CSN: [id number] Arrival date & time: 10/23/16  0221  By signing my name below, I, Charles Garza, attest that this documentation has been prepared under the direction and in the presence of [name], MD. Electronically Signed: Karren Cobble, ED Scribe. 10/23/16. 3:46 AM.  History   Chief Complaint Chief Complaint  Patient presents with  . Hand Injury    The history is provided by the patient. No language interpreter was used.  Hand Injury   The incident occurred more than 2 days ago. The incident occurred at work. The injury mechanism was a direct blow. The pain is present in the right hand. The pain has been worsening since the incident. Pertinent negatives include no fever. He reports no foreign bodies present. The symptoms are aggravated by movement, use and palpation.    HPI HPI Comments: Charles Garza is a 56 y.o. male with no pertinent history, who presents to the Emergency Department complaining of gradually worsening, persistent left hand pain that began four days ago. He notes associated chills. Pt reports when trying to swing at a bee he hit his right hand on the pressure washer. Initially, there was no pain or swelling but as the days have gone on the hand/arm have swollen and is red. He notes the skin was intact, but two days ago he noted an opening to the medial aspect of his left hand.  He tried Peroxide but there was no improvement of his symptoms. Denies chest pain, vomiting, or fever.  He denies any high pressure injection injury  PMH - none Home Medications    Prior to Admission medications   Medication Sig Start Date End Date Taking? Authorizing Provider  guaiFENesin (TUSSIN) 100 MG/5ML liquid Take 10 mLs (200 mg total) by mouth 3 (three) times daily as needed for cough. Patient not taking: Reported on 07/24/2016 07/10/16   Gerhard Munch, MD  predniSONE (STERAPRED UNI-PAK 21 TAB) 10 MG (21) TBPK tablet Take by mouth daily.  Take 6 tabs by mouth daily  for 2 days, then 5 tabs for 2 days, then 4 tabs for 2 days, then 3 tabs for 2 days, 2 tabs for 2 days, then 1 tab by mouth daily for 2 days 07/24/16   Lorre Nick, MD   Family History No family history on file.  Social History Social History  Substance Use Topics  . Smoking status: Former Games developer  . Smokeless tobacco: Current User  . Alcohol use No    Allergies   Penicillins  Review of Systems Review of Systems  Constitutional: Positive for chills. Negative for fever.  Cardiovascular: Negative for chest pain.  Gastrointestinal: Negative for vomiting.  All other systems reviewed and are negative.  Physical Exam Updated Vital Signs BP (!) 137/92 (BP Location: Left Arm)   Pulse 98   Temp 98 F (36.7 C) (Oral)   Resp 18   Ht 5\' 6"  (1.676 m)   Wt 156 lb (70.8 kg)   SpO2 97%   BMI 25.18 kg/m   Physical Exam CONSTITUTIONAL: Well developed/well nourished HEAD: Normocephalic/atraumatic EYES: EOMI ENMT: Mucous membranes moist NECK: supple no meningeal signs SPINE/BACK:entire spine nontender CV: S1/S2 noted, no murmurs/rubs/gallops noted LUNGS: Lungs are clear to auscultation bilaterally, no apparent distress ABDOMEN: soft, nontender, no rebound or guarding, bowel sounds noted throughout abdomen GU:no cva tenderness NEURO: Pt is awake/alert/appropriate, moves all extremitiesx4.  No facial droop.   EXTREMITIES: pulses normal/equal, full ROM, significant soft tissue swelling to the right  hand, significant tenderness with ROM of right index finger, no crepitus SKIN: warm, color normal PSYCH: no abnormalities of mood noted, alert and oriented to situation      Patient gave verbal permission to utilize photo for medical documentation only The image was not stored on any personal device  ED Treatments / Results  DIAGNOSTIC STUDIES: Oxygen Saturation is 97% on RA, adequate by my interpretation.   COORDINATION OF CARE: 3:44 AM-Discussed next  steps with pt. Pt verbalized understanding and is agreeable with the plan.   Labs (all labs ordered are listed, but only abnormal results are displayed) Labs Reviewed  BASIC METABOLIC PANEL - Abnormal; Notable for the following:       Result Value   Glucose, Bld 107 (*)    Creatinine, Ser 1.29 (*)    All other components within normal limits  CBC WITH DIFFERENTIAL/PLATELET - Abnormal; Notable for the following:    WBC 11.8 (*)    Neutro Abs 8.0 (*)    Monocytes Absolute 1.1 (*)    All other components within normal limits  HIV ANTIBODY (ROUTINE TESTING)  LACTIC ACID, PLASMA    EKG  EKG Interpretation None       Radiology Dg Hand Complete Right  Result Date: 10/23/2016 CLINICAL DATA:  Injury to the right hand 2 days ago. Small laceration at the second MCP joint. Right hand swelling and unable to straighten fingers completely. EXAM: RIGHT HAND - COMPLETE 3+ VIEW COMPARISON:  None. FINDINGS: Dorsal soft tissue swelling over the right hand. No radiopaque soft tissue foreign bodies or gas collections identified. No evidence of acute fracture or dislocation. No focal bone lesions or bone destruction. IMPRESSION: Soft tissue swelling over the right hand. No acute bony abnormalities. Electronically Signed   By: Burman NievesWilliam  Stevens M.D.   On: 10/23/2016 03:35    Procedures Procedures (including critical care time)  Medications Ordered in ED Medications  Tdap (BOOSTRIX) injection 0.5 mL (0.5 mLs Intramuscular Not Given 10/23/16 0401)  0.9 %  sodium chloride infusion ( Intravenous New Bag/Given 10/23/16 0559)  acetaminophen (TYLENOL) tablet 650 mg (not administered)    Or  acetaminophen (TYLENOL) suppository 650 mg (not administered)  HYDROcodone-acetaminophen (NORCO/VICODIN) 5-325 MG per tablet 1-2 tablet (not administered)  senna-docusate (Senokot-S) tablet 1 tablet (not administered)  bisacodyl (DULCOLAX) EC tablet 5 mg (not administered)  morphine 4 MG/ML injection 2-4 mg (not  administered)  vancomycin (VANCOCIN) 1,500 mg in sodium chloride 0.9 % 500 mL IVPB (not administered)  fentaNYL (SUBLIMAZE) injection 100 mcg (100 mcg Intravenous Given 10/23/16 0401)  clindamycin (CLEOCIN) IVPB 600 mg (0 mg Intravenous Stopped 10/23/16 0431)  fentaNYL (SUBLIMAZE) injection 50 mcg (50 mcg Intravenous Given 10/23/16 0533)    Initial Impression / Assessment and Plan / ED Course  I have reviewed the triage vital signs and the nursing notes.  Pertinent labs & imaging results that were available during my care of the patient were reviewed by me and considered in my medical decision making (see chart for details).     Pt with significant cellulitis/liley abscess and possible flexor tenosynovitis D/w dr Mina Marbleweingold He will take to OR later this morning Admit to hospitalist D/w dr opyd for admission Pt agreeable  Final Clinical Impressions(s) / ED Diagnoses   Final diagnoses:  Cellulitis of right hand    New Prescriptions New Prescriptions   No medications on file  I personally performed the services described in this documentation, which was scribed in my presence. The recorded information has  been reviewed and is accurate.        Zadie RhineWickline, Eleanor Dimichele, MD 10/23/16 [phone]

## 2016-10-23 NOTE — Progress Notes (Signed)
Patient seen and examined at bedside, patient admitted after midnight, please see earlier detailed admission note by Briscoe Deutscherimothy S Opyd, MD. Briefly, patient presented with a hand abscess. Hand surgery took patient to OR for I&D. Plan for discharge tomorrow. Cultures pending.   Jacquelin Hawkingalph Sherill Mangen, MD [facility] 10/23/2016, 10:50 AM Pager: [phone]

## 2016-10-24 ENCOUNTER — Encounter (HOSPITAL_COMMUNITY): Payer: Self-pay | Admitting: Orthopedic Surgery

## 2016-10-24 DIAGNOSIS — L02511 Cutaneous abscess of right hand: Secondary | ICD-10-CM

## 2016-10-24 DIAGNOSIS — N289 Disorder of kidney and ureter, unspecified: Secondary | ICD-10-CM

## 2016-10-24 DIAGNOSIS — L03113 Cellulitis of right upper limb: Secondary | ICD-10-CM

## 2016-10-24 LAB — CBC
HCT: 43 % (ref 39.0–52.0)
Hemoglobin: 14.2 g/dL (ref 13.0–17.0)
MCH: 30.1 pg (ref 26.0–34.0)
MCHC: 33 g/dL (ref 30.0–36.0)
MCV: 91.3 fL (ref 78.0–100.0)
Platelets: 280 10*3/uL (ref 150–400)
RBC: 4.71 MIL/uL (ref 4.22–5.81)
RDW: 12.9 % (ref 11.5–15.5)
WBC: 11.2 10*3/uL — ABNORMAL HIGH (ref 4.0–10.5)

## 2016-10-24 LAB — BASIC METABOLIC PANEL
Anion gap: 6 (ref 5–15)
BUN: 8 mg/dL (ref 6–20)
CO2: 28 mmol/L (ref 22–32)
Calcium: 8.9 mg/dL (ref 8.9–10.3)
Chloride: 104 mmol/L (ref 101–111)
Creatinine, Ser: 1.17 mg/dL (ref 0.61–1.24)
GFR calc Af Amer: >60 mL/min (ref >60)
GFR calc non Af Amer: >60 mL/min (ref >60)
Glucose, Bld: 99 mg/dL (ref 65–99)
Potassium: 4.2 mmol/L (ref 3.5–5.1)
Sodium: 138 mmol/L (ref 135–145)

## 2016-10-24 LAB — GLUCOSE, CAPILLARY: GLUCOSE-CAPILLARY: 99 mg/dL (ref 65–99)

## 2016-10-24 MED ORDER — HYDROCODONE-ACETAMINOPHEN 5-325 MG PO TABS: 1.0000 | ORAL_TABLET | ORAL | 0 refills | Status: DC | PRN

## 2016-10-24 MED ORDER — SENNOSIDES-DOCUSATE SODIUM 8.6-50 MG PO TABS: 1.0000 | ORAL_TABLET | Freq: Every day | ORAL | 0 refills | Status: DC

## 2016-10-24 MED ORDER — DOXYCYCLINE HYCLATE 100 MG PO TABS: 100.0000 mg | ORAL_TABLET | Freq: Two times a day (BID) | ORAL | 0 refills | Status: AC

## 2016-10-24 NOTE — Discharge Summary (Signed)
Physician Discharge Summary  Charles Garza RUE:[id number] DOB: 09/04/1960 DOA: 10/23/2016  PCP: Patient, No Pcp Per  Admit date: 10/23/2016 Discharge date: 10/24/2016  Admitted From: Home Disposition: Home  Recommendations for Outpatient Follow-up:  1. Follow up with hand surgery on 7/31 for unpacking and wound therapy 2. Please obtain BMP/CBC in one week 3. Please follow up on the following pending results: Wound culture  Home Health: None Equipment/Devices: None  Discharge Condition: Stable CODE STATUS: Full code Diet recommendation: Heart heatlyh   Brief/Interim Summary:  Admission HPI written by Briscoe Deutscherimothy S Opyd, MD   Chief Complaint: Right hand pain, swelling, drainage with foul odor   HPI: Charles Garza is a generally healthy 56 y.o. male with no known past medical history, now presenting to the emergency department for evaluation of pain, swelling, redness, and drainage with foul odor from the right hand. Patient reports that he was in his usual state of health when he struck his right hand on a piece of machinery at work on 10/19/2016 while trying to swat at a bee. He denies any significant pain at that time, but swelling and pain developed the following day. Since that time, there has been progressive swelling centered around the second MCP with erythema and increasing pain. He reports that yesterday, spontaneous drainage was noted from the radial aspect of second MCP with a foul odor. He denies any history diabetes mellitus, denies history of skin or soft tissue infections, and denies any fevers or chills.  ED Course: Upon arrival to the ED, patient is found to be afebrile, saturating well on room air, and with vital signs otherwise stable. Chemistry panels notable for serum creatinine 1.29, up from 1.19 previously. CBC features a leukocytosis to 11,800. Radiograph of the right hand demonstrates soft tissue swelling over the right hand without any acute bony abnormality. Patient  was treated with fentanyl in the ED, empiric clindamycin, and his Tdap was updated. And surgery was consulted by the ED physician and advised for a medical admission with IV antibiotics and plan for surgery this morning. Patient has remained hemodynamically stable in the emergency department, has not been in any apparent respiratory distress, and will be admitted to medical/surgical unit for ongoing evaluation and management of right hand cellulitis with abscess.   Hospital course:  Abscess and cellulitis of right hand Patient underwent incision and drainage on 7/29. Cultures obtained with gram stain significant for gram positive cocci in clusters. Leukocytosis stable. Outpatient follow-up with hand surgery. Discharged on Doxycycline at discharge.  Acute kidney injury Secondary to dehydration. Resolved with IV fluids.  Discharge Diagnoses:  Principal Problem:   Cellulitis of hand Active Problems:   Renal insufficiency, mild   Abscess of right hand    Discharge Instructions  Discharge Instructions    Call MD for:  difficulty breathing, headache or visual disturbances    Complete by:  As directed    Call MD for:  persistant dizziness or light-headedness    Complete by:  As directed    Call MD for:  persistant nausea and vomiting    Complete by:  As directed    Call MD for:  severe uncontrolled pain    Complete by:  As directed    Call MD for:  temperature >100.4    Complete by:  As directed    Diet - low sodium heart healthy    Complete by:  As directed    Increase activity slowly    Complete by:  As directed  Allergies as of 10/24/2016      Reactions   Penicillins Nausea And Vomiting, Other (See Comments)   Sick, dizziness.      Medication List    TAKE these medications   doxycycline 100 MG tablet Commonly known as:  VIBRA-TABS Take 1 tablet (100 mg total) by mouth every 12 (twelve) hours.   HYDROcodone-acetaminophen 5-325 MG tablet Commonly known as:   NORCO/VICODIN Take 1-2 tablets by mouth every 4 (four) hours as needed for moderate pain.   senna-docusate 8.6-50 MG tablet Commonly known as:  Senokot-S Take 1 tablet by mouth at bedtime. Take while using narcotic pain medication (Norco/Vicodin)      Follow-up Information    Dairl PonderWeingold, Matthew, MD Follow up on 10/25/2016.   Specialty:  Orthopedic Surgery Why:  For wound re-check Contact information: [address] [place] [id number] [phone]          Allergies  Allergen Reactions  . Penicillins Nausea And Vomiting and Other (See Comments)    Sick, dizziness.    Consultations:  Orthopedic surgery, Dr. Mina MarbleWeingold   Procedures/Studies: Dg Hand Complete Right  Result Date: 10/23/2016 CLINICAL DATA:  Injury to the right hand 2 days ago. Small laceration at the second MCP joint. Right hand swelling and unable to straighten fingers completely. EXAM: RIGHT HAND - COMPLETE 3+ VIEW COMPARISON:  None. FINDINGS: Dorsal soft tissue swelling over the right hand. No radiopaque soft tissue foreign bodies or gas collections identified. No evidence of acute fracture or dislocation. No focal bone lesions or bone destruction. IMPRESSION: Soft tissue swelling over the right hand. No acute bony abnormalities. Electronically Signed   By: Burman NievesWilliam  Stevens M.D.   On: 10/23/2016 03:35       Subjective: Mild tenderness. No other complaints  Discharge Exam: Vitals:   10/23/16 2108 10/24/16 0456  BP: 129/69 (!) 145/78  Pulse: 88 78  Resp: 19 19  Temp: 98.5 F (36.9 C) 99.9 F (37.7 C)   Vitals:   10/23/16 1212 10/23/16 1846 10/23/16 2108 10/24/16 0456  BP: 125/80 132/76 129/69 (!) 145/78  Pulse: 74 72 88 78  Resp: 13 14 19 19   Temp: 98.7 F (37.1 C) 98 F (36.7 C) 98.5 F (36.9 C) 99.9 F (37.7 C)  TempSrc: Oral Oral Oral Oral  SpO2: 95% 97% 97% 98%  Weight:      Height:        General: Pt is alert, awake, not in acute distress Cardiovascular: RRR, S1/S2 +, no rubs,  no gallops Respiratory: CTA bilaterally, no wheezing, no rhonchi Abdominal: Soft, NT, ND, bowel sounds + Extremities: no edema, no cyanosis, able to move fingers of right hand    The results of significant diagnostics from this hospitalization (including imaging, microbiology, ancillary and laboratory) are listed below for reference.     Microbiology: Recent Results (from the past 240 hour(s))  Surgical pcr screen     Status: None   Collection Time: 10/23/16  8:29 AM  Result Value Ref Range Status   MRSA, PCR NEGATIVE NEGATIVE Final   Staphylococcus aureus NEGATIVE NEGATIVE Final    Comment:        The Xpert SA Assay (FDA approved for NASAL specimens in patients over 55 years of age), is one component of a comprehensive surveillance program.  Test performance has been validated by [facility] for patients greater than or equal to 55 year old. It is not intended to diagnose infection nor to guide or monitor treatment.   Aerobic/Anaerobic Culture (  surgical/deep wound)     Status: None (Preliminary result)   Collection Time: 10/23/16  9:19 AM  Result Value Ref Range Status   Specimen Description ABSCESS RIGHT HAND  Final   Special Requests NONE  Final   Gram Stain   Final    FEW WBC PRESENT, PREDOMINANTLY PMN MODERATE GRAM POSITIVE COCCI IN CLUSTERS RARE SQUAMOUS EPITHELIAL CELLS PRESENT    Culture PENDING  Incomplete   Report Status PENDING  Incomplete     Labs: BNP (last 3 results) No results for input(s): BNP in the last 8760 hours. Basic Metabolic Panel:  Recent Labs Lab 10/23/16 0310 10/24/16 0545  NA 136 138  K 3.9 4.2  CL 101 104  CO2 27 28  GLUCOSE 107* 99  BUN 11 8  CREATININE 1.29* 1.17  CALCIUM 9.1 8.9   Liver Function Tests: No results for input(s): AST, ALT, ALKPHOS, BILITOT, PROT, ALBUMIN in the last 168 hours. No results for input(s): LIPASE, AMYLASE in the last 168 hours. No results for input(s): AMMONIA in the last 168  hours. CBC:  Recent Labs Lab 10/23/16 0310 10/24/16 0545  WBC 11.8* 11.2*  NEUTROABS 8.0*  --   HGB 15.1 14.2  HCT 43.1 43.0  MCV 90.7 91.3  PLT 253 280   Cardiac Enzymes: No results for input(s): CKTOTAL, CKMB, CKMBINDEX, TROPONINI in the last 168 hours. BNP: Invalid input(s): POCBNP CBG:  Recent Labs Lab 10/23/16 0800  GLUCAP 98   D-Dimer No results for input(s): DDIMER in the last 72 hours. Hgb A1c No results for input(s): HGBA1C in the last 72 hours. Lipid Profile No results for input(s): CHOL, HDL, LDLCALC, TRIG, CHOLHDL, LDLDIRECT in the last 72 hours. Thyroid function studies No results for input(s): TSH, T4TOTAL, T3FREE, THYROIDAB in the last 72 hours.  Invalid input(s): FREET3 Anemia work up No results for input(s): VITAMINB12, FOLATE, FERRITIN, TIBC, IRON, RETICCTPCT in the last 72 hours. Urinalysis No results found for: COLORURINE, APPEARANCEUR, LABSPEC, PHURINE, GLUCOSEU, HGBUR, BILIRUBINUR, KETONESUR, PROTEINUR, UROBILINOGEN, NITRITE, LEUKOCYTESUR Sepsis Labs Invalid input(s): PROCALCITONIN,  WBC,  LACTICIDVEN Microbiology Recent Results (from the past 240 hour(s))  Surgical pcr screen     Status: None   Collection Time: 10/23/16  8:29 AM  Result Value Ref Range Status   MRSA, PCR NEGATIVE NEGATIVE Final   Staphylococcus aureus NEGATIVE NEGATIVE Final    Comment:        The Xpert SA Assay (FDA approved for NASAL specimens in patients over 31 years of age), is one component of a comprehensive surveillance program.  Test performance has been validated by [facility] for patients greater than or equal to 53 year old. It is not intended to diagnose infection nor to guide or monitor treatment.   Aerobic/Anaerobic Culture (surgical/deep wound)     Status: None (Preliminary result)   Collection Time: 10/23/16  9:19 AM  Result Value Ref Range Status   Specimen Description ABSCESS RIGHT HAND  Final   Special Requests NONE  Final   Gram Stain    Final    FEW WBC PRESENT, PREDOMINANTLY PMN MODERATE GRAM POSITIVE COCCI IN CLUSTERS RARE SQUAMOUS EPITHELIAL CELLS PRESENT    Culture PENDING  Incomplete   Report Status PENDING  Incomplete    SIGNED:   Jacquelin Hawking, MD [facility] 10/24/2016, 8:03 AM Pager [phone]  If 7PM-7AM, please contact night-coverage [email or web address] Password [email or web address]

## 2016-10-24 NOTE — Discharge Instructions (Signed)

## 2016-10-24 NOTE — Care Management Note (Signed)
Case Management Note  Patient Details  Name: Charles JonesLester Alonzo MRN: [id number] Date of Birth: 10/28/1960  Subjective/Objective:  Right hand, swelling, and drainage with foul odor, Abscess and cellulitis of right hand                 Action/Plan: Discharge Planning: NCM spoke to pt and he does not have insurance. He works full-time. Provided pt with goodrx coupon for Rx, doxycycline $11 and Norco $6 at [employer]. Pt to follow up with orthopedic surgeon for hand.   Expected Discharge Date:  10/24/16               Expected Discharge Plan:  Home/Self Care  In-House Referral:  NA  Discharge planning Services  CM Consult  Post Acute Care Choice:  NA Choice offered to:  NA  DME Arranged:  N/A DME Agency:  NA  HH Arranged:  NA HH Agency:  NA  Status of Service:  Completed, signed off  If discussed at Long Length of Stay Meetings, dates discussed:    Additional Comments:  Elliot CousinShavis, [place] Nonaka Ellen, RN 10/24/2016, 12:24 PM

## 2016-10-24 NOTE — Progress Notes (Signed)
Patient Discharge: Disposition:  Patient discharged to home.  Education: Reviewed medications, prescriptions, follow-up appointment and discharge instructions, verbalized understanding. IV: Discontinued IV before discharge. Telemetry: N/A Transportation: Patient escorted out of the unit. Belongings: Patient took all his belongings with him.

## 2016-10-28 LAB — AEROBIC/ANAEROBIC CULTURE W GRAM STAIN (SURGICAL/DEEP WOUND)

## 2017-12-28 IMAGING — CR DG CHEST 2V
3 series · 3 of 3 positions shown · non-contrast
Comparison: 07/10/2016

CLINICAL DATA: CP x 1 month. No relevant surgical hx. Patient is a
smoker. Has high BP but not on meds.

EXAM:
CHEST  2 VIEW

[chest pa (1 of 2)]
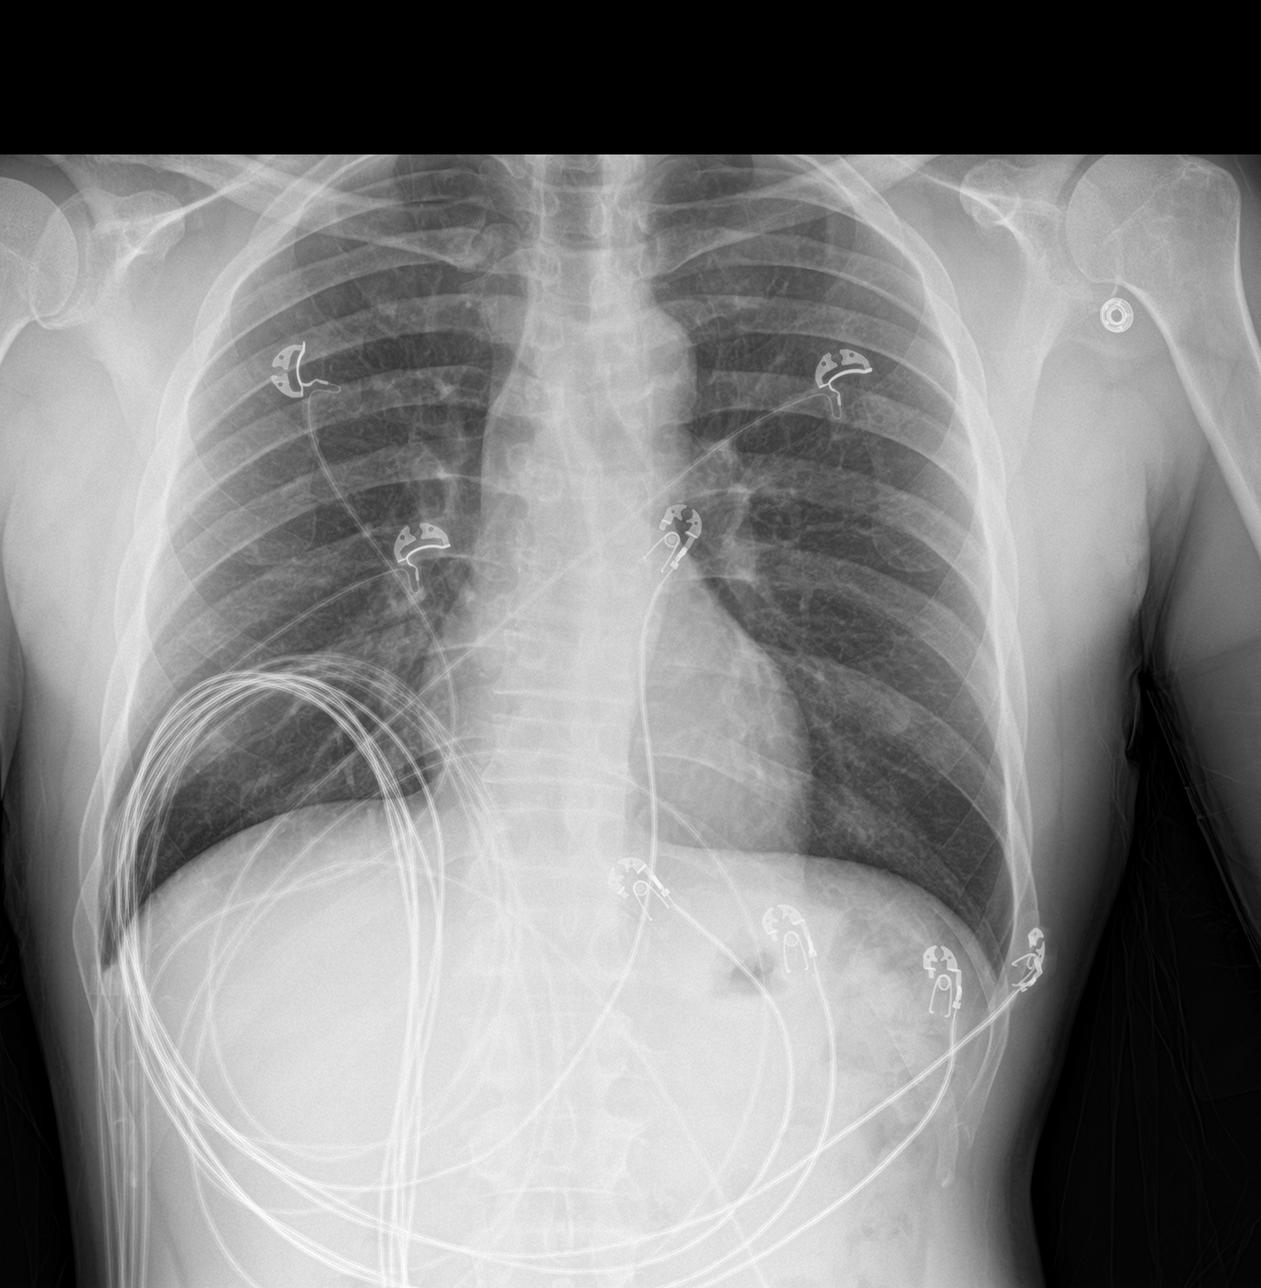

[chest lat]
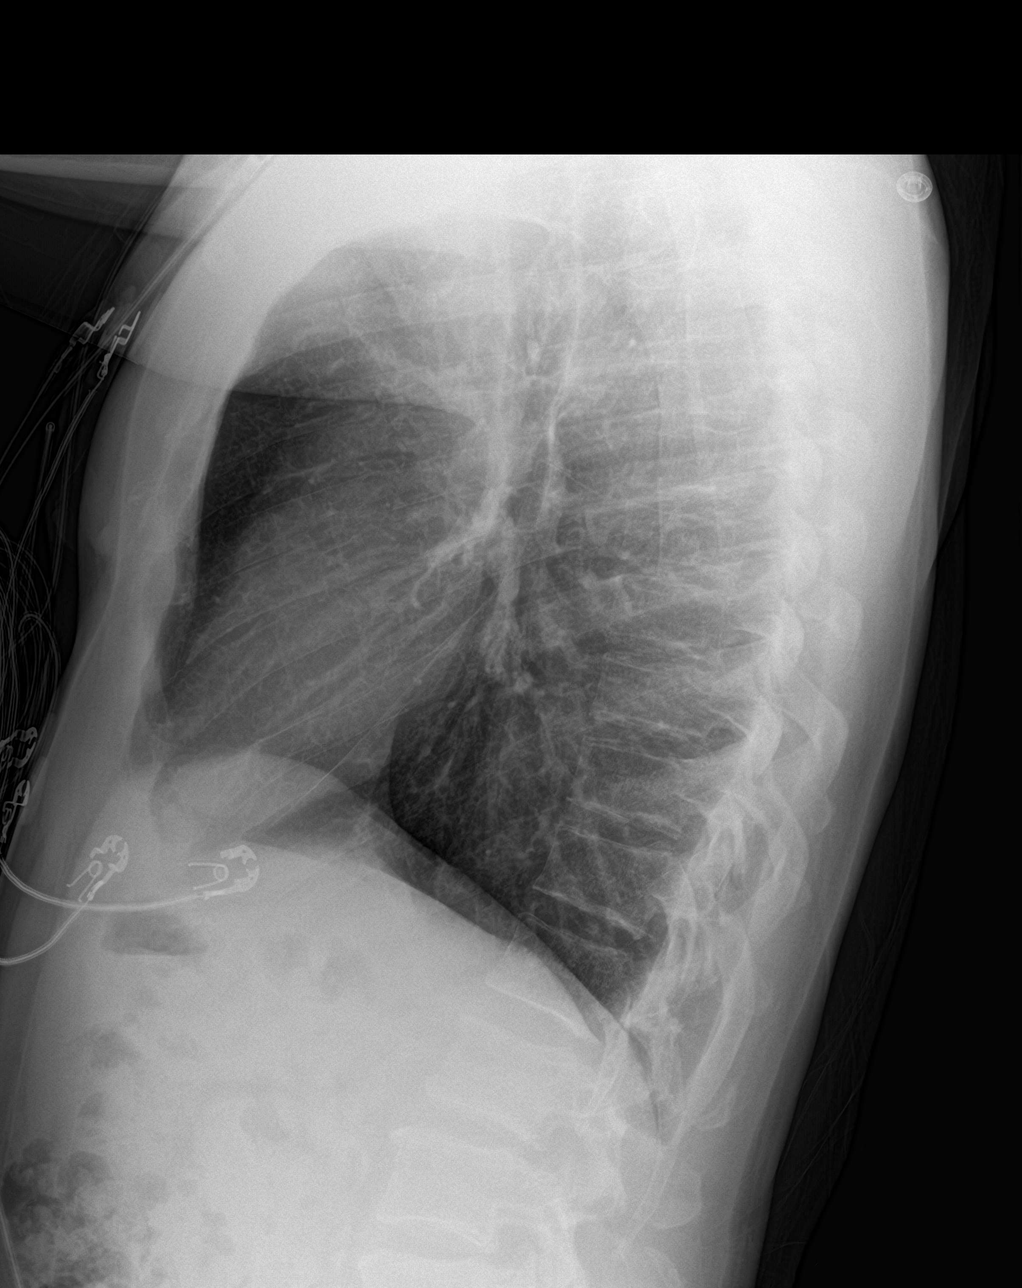

[chest pa (2 of 2)]
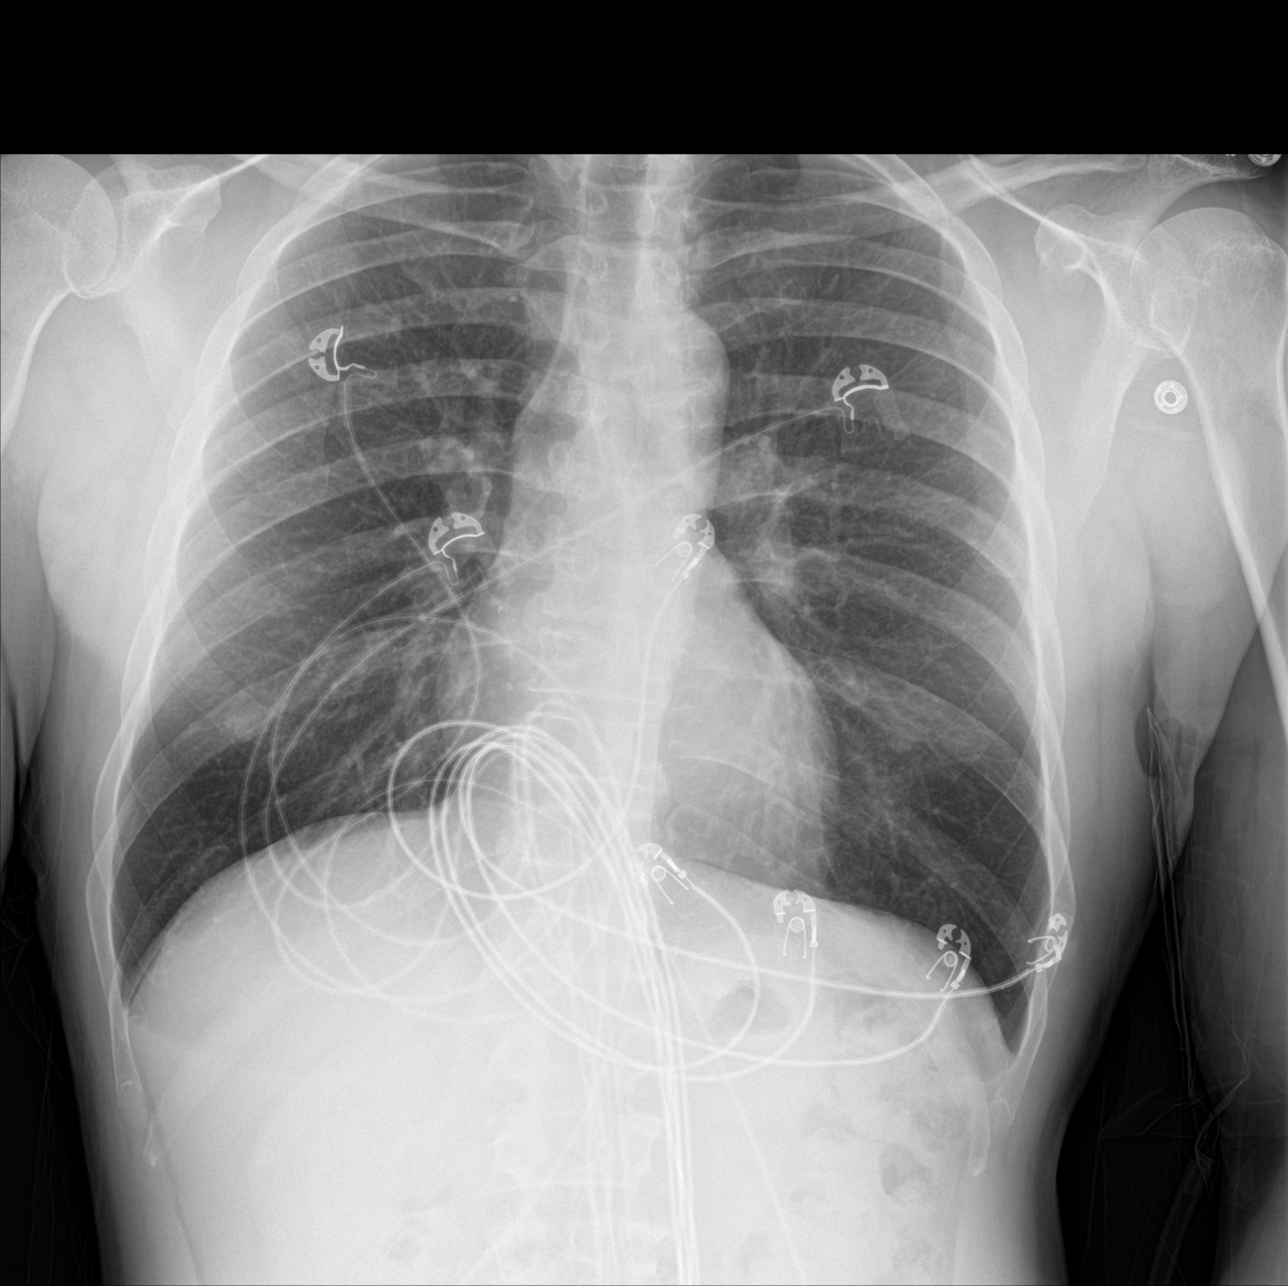

[3 of 3 positions shown; findings below may reference images not displayed]

FINDINGS: The heart size and mediastinal contours are within normal limits.
Lungs are mildly hyperexpanded but clear. No pleural effusion or
pneumothorax. The visualized skeletal structures are unremarkable.
IMPRESSION: No active cardiopulmonary disease.

## 2019-01-31 ENCOUNTER — Other Ambulatory Visit: Payer: Self-pay

## 2019-01-31 ENCOUNTER — Emergency Department (HOSPITAL_COMMUNITY)
Admission: EM | Admit: 2019-01-31 | Discharge: 2019-01-31 | Disposition: A | Discharge status: Home / Self Care | Payer: Self-pay | Attending: Emergency Medicine | Admitting: Emergency Medicine

## 2019-01-31 ENCOUNTER — Encounter (HOSPITAL_COMMUNITY): Payer: Self-pay | Admitting: Emergency Medicine

## 2019-01-31 DIAGNOSIS — Z880 Allergy status to penicillin: Secondary | ICD-10-CM | POA: Insufficient documentation

## 2019-01-31 DIAGNOSIS — E119 Type 2 diabetes mellitus without complications: Secondary | ICD-10-CM | POA: Insufficient documentation

## 2019-01-31 DIAGNOSIS — M5442 Lumbago with sciatica, left side: Secondary | ICD-10-CM | POA: Insufficient documentation

## 2019-01-31 DIAGNOSIS — Z87891 Personal history of nicotine dependence: Secondary | ICD-10-CM | POA: Insufficient documentation

## 2019-01-31 DIAGNOSIS — I10 Essential (primary) hypertension: Secondary | ICD-10-CM | POA: Insufficient documentation

## 2019-01-31 MED ORDER — NAPROXEN 500 MG PO TABS: 500.0000 mg | ORAL_TABLET | Freq: Two times a day (BID) | ORAL | 0 refills | Status: DC

## 2019-01-31 MED ORDER — METHOCARBAMOL 500 MG PO TABS: 500.0000 mg | ORAL_TABLET | Freq: Two times a day (BID) | ORAL | 0 refills | Status: DC

## 2019-01-31 MED ORDER — KETOROLAC TROMETHAMINE 30 MG/ML IJ SOLN
30.0000 mg | Freq: Once | INTRAMUSCULAR | Status: AC
  Administered 2019-01-31: 15:00:00 30 mg via INTRAMUSCULAR
  Filled 2019-01-31: qty 1

## 2019-01-31 MED ORDER — METHYLPREDNISOLONE 4 MG PO TBPK: ORAL_TABLET | ORAL | 0 refills | Status: DC

## 2019-01-31 MED ORDER — OXYCODONE-ACETAMINOPHEN 5-325 MG PO TABS
2.0000 | ORAL_TABLET | Freq: Once | ORAL | Status: AC
  Administered 2019-01-31: 15:00:00 2 via ORAL
  Filled 2019-01-31: qty 2

## 2019-01-31 MED ORDER — METHOCARBAMOL 500 MG PO TABS
750.0000 mg | ORAL_TABLET | Freq: Once | ORAL | Status: AC
  Administered 2019-01-31: 15:00:00 750 mg via ORAL
  Filled 2019-01-31: qty 2

## 2019-01-31 MED ORDER — PREDNISONE 20 MG PO TABS
60.0000 mg | ORAL_TABLET | Freq: Once | ORAL | Status: AC
  Administered 2019-01-31: 15:00:00 60 mg via ORAL
  Filled 2019-01-31: qty 3

## 2019-01-31 MED ORDER — LIDOCAINE 5 % EX PTCH
2.0000 | MEDICATED_PATCH | CUTANEOUS | Status: DC
  Administered 2019-01-31: 15:00:00 1 via TRANSDERMAL
  Filled 2019-01-31: qty 2

## 2019-01-31 MED ORDER — OXYCODONE-ACETAMINOPHEN 5-325 MG PO TABS: 1.0000 | ORAL_TABLET | Freq: Four times a day (QID) | ORAL | 0 refills | Status: DC | PRN

## 2019-01-31 NOTE — ED Triage Notes (Signed)
Pt arrives via EMS for lower back pain that has been going on for 5-6 years. Worsened today. No falls/injuries. Pt is ambulatory. BP 136/84, HR 82

## 2019-01-31 NOTE — ED Provider Notes (Signed)
[facility] EMERGENCY DEPARTMENT Provider Note   CSN: [id number] Arrival date & time: 01/31/19  1341     History   Chief Complaint Chief Complaint  Patient presents with  . Back Pain    HPI Charles Garza is a 58 y.o. male.     Charles Garza is a 58 y.o. male with a history of HTN and diabetes, who presents via EMS for evaluation of low back pain. Patient reports he has had this pain for 5-6 years but has ignored or dealt with it until now. Patient reports after finishing work yesterday, pain seemed to worsen. He reports pain is a severe constant ache that is worse with movement and palpation. Patient reports some tingling and decreased sensation reported in the left leg but no weakness. No loss of bowel or bladder control or saddle anesthesia. No associated abdominal pain, no dysuria. No fevers. No hx of IVDU or cancer.     History reviewed. No pertinent past medical history.  Patient Active Problem List   Diagnosis Date Noted  . Abscess of right hand 10/24/2016  . Cellulitis of hand 10/23/2016  . Renal insufficiency, mild 10/23/2016  . Cellulitis of right hand     Past Surgical History:  Procedure Laterality Date  . INCISION AND DRAINAGE ABSCESS Right 10/23/2016   Procedure: INCISION AND DRAINAGE, RIGHT HAND;  Surgeon: Charlotte Crumb, MD;  Location: [facility];  Service: Orthopedics;  Laterality: Right;        Home Medications    Prior to Admission medications   Medication Sig Start Date End Date Taking? Authorizing Provider  HYDROcodone-acetaminophen (NORCO/VICODIN) 5-325 MG tablet Take 1-2 tablets by mouth every 4 (four) hours as needed for moderate pain. 10/24/16   Mariel Aloe, MD  methocarbamol (ROBAXIN) 500 MG tablet Take 1 tablet (500 mg total) by mouth 2 (two) times daily. 01/31/19   Jacqlyn Larsen, PA-C  methylPREDNISolone (MEDROL DOSEPAK) 4 MG TBPK tablet Take as directed 01/31/19   Jacqlyn Larsen, PA-C  naproxen (NAPROSYN) 500 MG tablet  Take 1 tablet (500 mg total) by mouth 2 (two) times daily. 01/31/19   Jacqlyn Larsen, PA-C  oxyCODONE-acetaminophen (PERCOCET) 5-325 MG tablet Take 1 tablet by mouth every 6 (six) hours as needed. 01/31/19   Jacqlyn Larsen, PA-C  senna-docusate (SENOKOT-S) 8.6-50 MG tablet Take 1 tablet by mouth at bedtime. Take while using narcotic pain medication (Norco/Vicodin) 10/24/16   Mariel Aloe, MD    Family History Family History  Problem Relation Age of Onset  . CAD Father   . Lung cancer Sister     Social History Social History   Tobacco Use  . Smoking status: Former Research scientist (life sciences)  . Smokeless tobacco: Current User  Substance Use Topics  . Alcohol use: No  . Drug use: No     Allergies   Penicillins   Review of Systems Review of Systems  Constitutional: Negative for chills and fever.  HENT: Negative.   Respiratory: Negative for shortness of breath.   Cardiovascular: Negative for chest pain.  Gastrointestinal: Negative for abdominal pain, constipation, diarrhea, nausea and vomiting.  Genitourinary: Negative for dysuria, flank pain, frequency and hematuria.  Musculoskeletal: Positive for back pain. Negative for arthralgias, gait problem, joint swelling, myalgias and neck pain.  Skin: Negative for color change, rash and wound.  Neurological: Positive for numbness. Negative for weakness.     Physical Exam Updated Vital Signs BP 122/72   Pulse 82   Temp 99 F (37.2  C) (Oral)   Resp 16   SpO2 97%   Physical Exam Vitals signs and nursing note reviewed.  Constitutional:      General: He is not in acute distress.    Appearance: Normal appearance. He is well-developed and normal weight. He is not diaphoretic.  HENT:     Head: Atraumatic.  Eyes:     General:        Right eye: No discharge.        Left eye: No discharge.  Neck:     Musculoskeletal: Neck supple.  Cardiovascular:     Rate and Rhythm: Normal rate and regular rhythm.     Pulses: Normal pulses.          Radial  pulses are 2+ on the right side and 2+ on the left side.       Dorsalis pedis pulses are 2+ on the right side and 2+ on the left side.       Posterior tibial pulses are 2+ on the right side and 2+ on the left side.  Pulmonary:     Effort: Pulmonary effort is normal. No respiratory distress.     Breath sounds: Normal breath sounds.     Comments: Respirations equal and unlabored, patient able to speak in full sentences, lungs clear to auscultation bilaterally Abdominal:     General: Bowel sounds are normal. There is no distension.     Palpations: Abdomen is soft. There is no mass.     Tenderness: There is no abdominal tenderness. There is no guarding.     Comments: Abdomen soft, nondistended, nontender to palpation in all quadrants without guarding or peritoneal signs, no CVA tenderness bilaterally  Musculoskeletal:     Comments: Tenderness to palpation over across the low back, worse over the left.  Pain made worse with range of motion of the lower extremities, positive straight leg raise on the left.  Skin:    General: Skin is warm and dry.     Capillary Refill: Capillary refill takes less than 2 seconds.  Neurological:     Mental Status: He is alert and oriented to person, place, and time.     Comments: Alert, clear speech, following commands. Moving all extremities without difficulty. Bilateral lower extremities with 5/5 strength in proximal and distal muscle groups and with dorsi and plantar flexion. Sensation intact in bilateral lower extremities, patient does report some decreased sensation in radicular pattern over the left lower extremities 2+ patellar DTRs bilaterally. Ambulatory with steady gait  Psychiatric:        Behavior: Behavior normal.      ED Treatments / Results  Labs (all labs ordered are listed, but only abnormal results are displayed) Labs Reviewed - No data to display  EKG None  Radiology No results found.  Procedures Procedures (including critical  care time)  Medications Ordered in ED Medications  lidocaine (LIDODERM) 5 % 2 patch (1 patch Transdermal Patch Applied 01/31/19 1450)  oxyCODONE-acetaminophen (PERCOCET/ROXICET) 5-325 MG per tablet 2 tablet (2 tablets Oral Given 01/31/19 1452)  methocarbamol (ROBAXIN) tablet 750 mg (750 mg Oral Given 01/31/19 1451)  ketorolac (TORADOL) 30 MG/ML injection 30 mg (30 mg Intramuscular Given 01/31/19 1451)  predniSONE (DELTASONE) tablet 60 mg (60 mg Oral Given 01/31/19 1451)     Initial Impression / Assessment and Plan / ED Course  I have reviewed the triage vital signs and the nursing notes.  Pertinent labs & imaging results that were available during my care of the  patient were reviewed by me and considered in my medical decision making (see chart for details).  Neurologic exam with some subjective decreased sensation over the left lower extremity, but normal strength bilaterally, no evidence of urinary incontinence or retention, pain is consistently reproducible. There is no evidence of AAA or concern for dissection at this time.   Patient can walk but states is painful.  No loss of bowel or bladder control.  No concern for cauda equina.  No fever, night sweats, weight loss, h/o cancer, IVDU.  Pain treated here in the department with adequate improvement. RICE protocol and pain medicine indicated and discussed with patient. Outpatient follow up with neurosurgery. I have also discussed reasons to return immediately to the ER.  Patient expresses understanding and agrees with plan.    Final Clinical Impressions(s) / ED Diagnoses   Final diagnoses:  Acute bilateral low back pain with left-sided sciatica    ED Discharge Orders         Ordered    naproxen (NAPROSYN) 500 MG tablet  2 times daily     01/31/19 1544    methylPREDNISolone (MEDROL DOSEPAK) 4 MG TBPK tablet     01/31/19 1544    methocarbamol (ROBAXIN) 500 MG tablet  2 times daily     01/31/19 1544    oxyCODONE-acetaminophen (PERCOCET)  5-325 MG tablet  Every 6 hours PRN     01/31/19 1544           Dartha Lodge, Cordelia Poche 02/01/19 2236    Glynn Octave, MD 02/02/19 [phone]

## 2019-01-31 NOTE — Discharge Instructions (Signed)
You were seen here today for Back Pain: Low back pain is discomfort in the lower back that may be due to injuries to muscles and ligaments around the spine. Occasionally, it may be caused by a problem to a part of the spine called a disc. Your back pain should be treated with medicines listed below as well as back exercises and this back pain should get better over the next 2 weeks. Most patients get completely well in 4 weeks. It is important to know however, if you develop severe or worsening pain, low back pain with fever, numbness, weakness or inability to walk or urinate, you should return to the ER immediately.  Please follow up with your doctor this week for a recheck if still having symptoms.  HOME INSTRUCTIONS Self - care:  The application of heat can help soothe the pain.  Maintaining your daily activities, including walking (this is encouraged), as it will help you get better faster than just staying in bed. Do not life, push, pull anything more than 10 pounds for the next week. I am attaching back exercises that you can do at home to help facilitate your recovery.   Back Exercises - I have attached a handout on back exercises that can be done at home to help facilitate your recovery.   Medications are also useful to help with pain control.   Acetaminophen.  This medication is generally safe, and found over the counter. Take as directed for your age. You should not take more than 8 of the extra strength (500mg ) pills a day (max dose is 4000mg  total OVER one day)  Non steroidal anti inflammatory: This includes medications including Ibuprofen, naproxen and Mobic; These medications help both pain and swelling and are very useful in treating back pain.  They should be taken with food, as they can cause stomach upset, and more seriously, stomach bleeding. Do not combine the medications.   Lidocaine Patch: Salon Pas lidocaine patches (blue and silver box) can be purchased over the counter and worn  for 12 hours for local pain relief   Muscle relaxants:  These medications can help with muscle tightness that is a cause of lower back pain.  Most of these medications can cause drowsiness, and it is not safe to drive or use dangerous machinery while taking them. They are primarily helpful when taken at night before sleep.  Prednisone - This is an oral steroid.  This medication is best taken with food in the morning.  Please note that this medication can cause anxiety, mood swings, muscle fatigue, increased hunger, weight gain (sodium/fluid retention), poor sleep as well as other symptoms. If you are a diabetic, please monitor your blood sugars at home as this medication can increase your blood sugars. Call your pharmacist if you have any questions.  You will need to follow up with your primary healthcare provider or the spine specialist in 1-2 weeks for reassessment and persistent symptoms.  Be aware that if you develop new symptoms, such as a fever, leg weakness, difficulty with or loss of control of your urine or bowels, abdominal pain, or more severe pain, you will need to seek medical attention and/or return to the Emergency department.  Additional Information:  Your vital signs today were: BP 122/72    Pulse 82    Temp 99 F (37.2 C) (Oral)    Resp 16    SpO2 97%  If your blood pressure (BP) was elevated above 135/85 this visit, please have  this repeated by your doctor within one month. ---------------

## 2019-10-18 ENCOUNTER — Other Ambulatory Visit: Payer: Self-pay

## 2019-10-18 ENCOUNTER — Encounter (HOSPITAL_COMMUNITY): Payer: Self-pay | Admitting: Emergency Medicine

## 2019-10-18 ENCOUNTER — Emergency Department (HOSPITAL_COMMUNITY)
Admission: EM | Admit: 2019-10-18 | Discharge: 2019-10-18 | Disposition: A | Discharge status: Home / Self Care | Payer: Self-pay | Attending: Emergency Medicine | Admitting: Emergency Medicine

## 2019-10-18 DIAGNOSIS — R05 Cough: Secondary | ICD-10-CM | POA: Insufficient documentation

## 2019-10-18 DIAGNOSIS — Z5321 Procedure and treatment not carried out due to patient leaving prior to being seen by health care provider: Secondary | ICD-10-CM | POA: Insufficient documentation

## 2019-10-18 NOTE — ED Triage Notes (Addendum)
Per EMS, pt was picked up off the street, reports a cough X1 week.    148/84 92HR 18RR  98%RA  Pt reports he started coughing b/c his significant other sprayed some cleaning chemicals. Pt had very minimal coughing in triage

## 2019-10-18 NOTE — ED Notes (Signed)
Called for VS recheck x3, no response. 

## 2020-06-16 ENCOUNTER — Emergency Department (HOSPITAL_COMMUNITY): Admission: EM | Admit: 2020-06-16 | Discharge: 2020-06-16 | Discharge status: Against Medical Advice | Payer: Self-pay

## 2020-06-16 ENCOUNTER — Other Ambulatory Visit: Payer: Self-pay

## 2020-06-16 NOTE — ED Notes (Signed)
Pt called for triage, no answer x1 

## 2020-06-16 NOTE — ED Notes (Signed)
Pt seen leaving the lobby before triage

## 2020-07-08 ENCOUNTER — Encounter (HOSPITAL_COMMUNITY): Payer: Self-pay | Admitting: *Deleted

## 2020-07-08 ENCOUNTER — Emergency Department (HOSPITAL_COMMUNITY)
Admission: EM | Admit: 2020-07-08 | Discharge: 2020-07-08 | Disposition: A | Discharge status: Home / Self Care | Payer: Self-pay | Attending: Emergency Medicine | Admitting: Emergency Medicine

## 2020-07-08 ENCOUNTER — Emergency Department (HOSPITAL_COMMUNITY): Payer: Self-pay

## 2020-07-08 DIAGNOSIS — R079 Chest pain, unspecified: Secondary | ICD-10-CM | POA: Insufficient documentation

## 2020-07-08 DIAGNOSIS — R062 Wheezing: Secondary | ICD-10-CM | POA: Insufficient documentation

## 2020-07-08 DIAGNOSIS — R0602 Shortness of breath: Secondary | ICD-10-CM | POA: Insufficient documentation

## 2020-07-08 DIAGNOSIS — R0981 Nasal congestion: Secondary | ICD-10-CM | POA: Insufficient documentation

## 2020-07-08 DIAGNOSIS — Z87891 Personal history of nicotine dependence: Secondary | ICD-10-CM | POA: Insufficient documentation

## 2020-07-08 DIAGNOSIS — R059 Cough, unspecified: Secondary | ICD-10-CM | POA: Insufficient documentation

## 2020-07-08 DIAGNOSIS — Z20822 Contact with and (suspected) exposure to covid-19: Secondary | ICD-10-CM | POA: Insufficient documentation

## 2020-07-08 LAB — POC SARS CORONAVIRUS 2 AG -  ED: SARS Coronavirus 2 Ag: NEGATIVE

## 2020-07-08 MED ORDER — IPRATROPIUM-ALBUTEROL 0.5-2.5 (3) MG/3ML IN SOLN
3.0000 mL | Freq: Once | RESPIRATORY_TRACT | Status: AC
  Administered 2020-07-08: 3 mL via RESPIRATORY_TRACT
  Filled 2020-07-08: qty 3

## 2020-07-08 MED ORDER — PREDNISONE 50 MG PO TABS
50.0000 mg | ORAL_TABLET | Freq: Once | ORAL | Status: AC
  Administered 2020-07-08: 50 mg via ORAL
  Filled 2020-07-08: qty 1

## 2020-07-08 MED ORDER — PREDNISONE 50 MG PO TABS: 50.0000 mg | ORAL_TABLET | Freq: Every day | ORAL | 0 refills | Status: DC

## 2020-07-08 MED ORDER — METHOCARBAMOL 500 MG PO TABS: 500.0000 mg | ORAL_TABLET | Freq: Three times a day (TID) | ORAL | 0 refills | Status: DC | PRN

## 2020-07-08 MED ORDER — ALBUTEROL SULFATE HFA 108 (90 BASE) MCG/ACT IN AERS
1.0000 | INHALATION_SPRAY | Freq: Once | RESPIRATORY_TRACT | Status: AC
  Administered 2020-07-08: 1 via RESPIRATORY_TRACT
  Filled 2020-07-08: qty 6.7

## 2020-07-08 MED ORDER — CETIRIZINE HCL 10 MG PO TABS: 10.0000 mg | ORAL_TABLET | Freq: Every day | ORAL | 0 refills | Status: AC

## 2020-07-08 NOTE — ED Triage Notes (Signed)
Pt complains of chest pain, worse with breathing, cough, x 3 weeks. 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 He has since quit but noticed symptoms are getting worse. Hx of emphysema.

## 2020-07-08 NOTE — Discharge Instructions (Signed)
You were seen in the emergency department today for trouble breathing with cough and chest pain.  Your x-ray was reassuring.  Your EKG looks similar to prior EKGs you have had.  Your rapid Covid test was negative.  We are sending you home with the following medicines: -Prednisone: This is a steroid to help with inflammation in your lungs/chest, please take this daily with breakfast, we gave you first dose here in the emergency department. -Zyrtec: Take daily as needed for allergies/congestion/cough. -Robaxin: Take every 8 hours as needed for chest discomfort.  This is a muscle relaxant, it can make you sleepy therefore do not drive or operate heavy machinery when taking it.  Do not take this with other sedating medicines/drugs or alcohol.  We have prescribed you new medication(s) today. Discuss the medications prescribed today with your pharmacist as they can have adverse effects and interactions with your other medicines including over the counter and prescribed medications. Seek medical evaluation if you start to experience new or abnormal symptoms after taking one of these medicines, seek care immediately if you start to experience difficulty breathing, feeling of your throat closing, facial swelling, or rash as these could be indications of a more serious allergic reaction  Continue to use inhaler 1 to 2 puffs every 4-6 hours as needed.  Please follow-up with primary care within 3 days.  Return to the ER for any new or worsening symptoms including but not limited to new or different pain, coughing up blood, passing out, increased trouble breathing, abdominal pain, leg swelling, fever, or any other concerns.

## 2020-07-08 NOTE — ED Provider Notes (Signed)
[facility]-EMERGENCY DEPT Provider Note   CSN: [id number] Arrival date & time: 07/08/20  1115     History Chief Complaint  Patient presents with  . Shortness of Breath    Charles Garza is a 60 y.o. male with a hx of emphysema who presents to the ED with complaints of dyspnea x 3 weeks. Patient reports he has had issues with cough intermittently productive of phlegm sputum, nasal congestion, dyspnea, wheezing, and subsequently developed chest pain. Chest pain is to the diffuse anterior chest, worse with coughing, deep breathing, and palpation. No alleviating factors. Denies fever, chills, syncope, leg pain/swelling, hemoptysis, recent surgery/trauma, recent long travel, hormone use, personal hx of cancer, or hx of DVT/PE. 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   HPI     History reviewed. No pertinent past medical history.  Patient Active Problem List   Diagnosis Date Noted  . Abscess of right hand 10/24/2016  . Cellulitis of hand 10/23/2016  . Renal insufficiency, mild 10/23/2016  . Cellulitis of right hand     Past Surgical History:  Procedure Laterality Date  . INCISION AND DRAINAGE ABSCESS Right 10/23/2016   Procedure: INCISION AND DRAINAGE, RIGHT HAND;  Surgeon: Dairl Ponder, MD;  Location: [facility] OR;  Service: Orthopedics;  Laterality: Right;       Family History  Problem Relation Age of Onset  . CAD Father   . Lung cancer Sister     Social History   Tobacco Use  . Smoking status: Former Games developer  . Smokeless tobacco: Current User  Substance Use Topics  . Alcohol use: No  . Drug use: No    Home Medications Prior to Admission medications   Medication Sig Start Date End Date Taking? Authorizing Provider  HYDROcodone-acetaminophen (NORCO/VICODIN) 5-325 MG tablet Take 1-2 tablets by mouth every 4 (four) hours as needed for moderate pain. 10/24/16   Narda Bonds, MD  methocarbamol (ROBAXIN)  500 MG tablet Take 1 tablet (500 mg total) by mouth 2 (two) times daily. 01/31/19   Dartha Lodge, PA-C  methylPREDNISolone (MEDROL DOSEPAK) 4 MG TBPK tablet Take as directed 01/31/19   Dartha Lodge, PA-C  naproxen (NAPROSYN) 500 MG tablet Take 1 tablet (500 mg total) by mouth 2 (two) times daily. 01/31/19   Dartha Lodge, PA-C  oxyCODONE-acetaminophen (PERCOCET) 5-325 MG tablet Take 1 tablet by mouth every 6 (six) hours as needed. 01/31/19   Dartha Lodge, PA-C  senna-docusate (SENOKOT-S) 8.6-50 MG tablet Take 1 tablet by mouth at bedtime. Take while using narcotic pain medication (Norco/Vicodin) 10/24/16   Narda Bonds, MD    Allergies    Penicillins  Review of Systems   Review of Systems  Constitutional: Negative for chills and fever.  HENT: Positive for congestion.   Respiratory: Positive for cough, shortness of breath and wheezing.   Cardiovascular: Positive for chest pain. Negative for leg swelling.  Gastrointestinal: Negative for abdominal pain, diarrhea, nausea and vomiting.  Neurological: Negative for syncope.  All other systems reviewed and are negative.   Physical Exam Updated Vital Signs BP (!) 156/90   Pulse 92   Temp 98.6 F (37 C) (Oral)   Resp 18   SpO2 100%   Physical Exam Vitals and nursing note reviewed.  Constitutional:      General: He is not in acute distress.    Appearance: He is well-developed. He is not toxic-appearing.  HENT:  Head: Normocephalic and atraumatic.     Right Ear: Ear canal normal. Tympanic membrane is not perforated, erythematous, retracted or bulging.     Left Ear: Ear canal normal. Tympanic membrane is not perforated, erythematous, retracted or bulging.     Ears:     Comments: No mastoid erythema/swellng/tenderness.     Nose: Congestion present.     Right Sinus: No maxillary sinus tenderness or frontal sinus tenderness.     Left Sinus: No maxillary sinus tenderness or frontal sinus tenderness.     Mouth/Throat:     Pharynx:  Oropharynx is clear. Uvula midline. No oropharyngeal exudate or posterior oropharyngeal erythema.     Comments: Posterior oropharynx is symmetric appearing. Patient tolerating own secretions without difficulty. No trismus. No drooling. No hot potato voice. No swelling beneath the tongue, submandibular compartment is soft.  Eyes:     General:        Right eye: No discharge.        Left eye: No discharge.     Conjunctiva/sclera: Conjunctivae normal.  Cardiovascular:     Rate and Rhythm: Normal rate and regular rhythm.  Pulmonary:     Effort: No respiratory distress.     Breath sounds: Wheezing (faint expiratory) present. No rhonchi or rales.  Chest:     Chest wall: Tenderness (anterior chest wall) present.  Abdominal:     General: There is no distension.     Palpations: Abdomen is soft.     Tenderness: There is no abdominal tenderness.  Musculoskeletal:     Cervical back: Neck supple. No rigidity.     Right lower leg: No tenderness. No edema.     Left lower leg: No tenderness. No edema.  Lymphadenopathy:     Cervical: No cervical adenopathy.  Skin:    General: Skin is warm and dry.     Findings: No rash.  Neurological:     Mental Status: He is alert.  Psychiatric:        Behavior: Behavior normal.     ED Results / Procedures / Treatments   Labs (all labs ordered are listed, but only abnormal results are displayed) Labs Reviewed  POC SARS CORONAVIRUS 2 AG -  ED    EKG EKG Interpretation  Date/Time:  Wednesday July 08 2020 11:43:53 EDT Ventricular Rate:  80 PR Interval:  144 QRS Duration: 76 QT Interval:  352 QTC Calculation: 406 R Axis:   67 Text Interpretation: Sinus rhythm Probable anteroseptal infarct, old NSR, no change from previous Confirmed by Coralee Pesa [phone]) on 07/08/2020 1:06:44 PM   Radiology DG Chest 2 View  Result Date: 07/08/2020 CLINICAL DATA:  Shortness of breath, chest pain EXAM: CHEST - 2 VIEW COMPARISON:  07/24/2016 FINDINGS: Heart and  mediastinal contours are within normal limits. No focal opacities or effusions. No acute bony abnormality. IMPRESSION: No active cardiopulmonary disease. Electronically Signed   By: Charlett Nose M.D.   On: 07/08/2020 12:00    Procedures Procedures   Medications Ordered in ED Medications  predniSONE (DELTASONE) tablet 50 mg (has no administration in time range)  albuterol (VENTOLIN HFA) 108 (90 Base) MCG/ACT inhaler 1 puff (has no administration in time range)  ipratropium-albuterol (DUONEB) 0.5-2.5 (3) MG/3ML nebulizer solution 3 mL (3 mLs Nebulization Given 07/08/20 1242)    ED Course  I have reviewed the triage vital signs and the nursing notes.  Pertinent labs & imaging results that were available during my care of the patient were reviewed by me and considered  in my medical decision making (see chart for details).  Axxel Gude was evaluated in Emergency Department on 07/08/2020 for the symptoms described in the history of present illness. He/she was evaluated in the context of the global COVID-19 pandemic, which necessitated consideration that the patient might be at risk for infection with the SARS-CoV-2 virus that causes COVID-19. Institutional protocols and algorithms that pertain to the evaluation of patients at risk for COVID-19 are in a state of rapid change based on information released by regulatory bodies including the CDC and federal and state organizations. These policies and algorithms were followed during the patient's care in the ED.    MDM Rules/Calculators/A&P                          Patient presents to the ED with complaints of respiratory sxs x 3 weeks.  Nontoxic, vitals without significant abnormality, BP elevated- doubt HTN emergency.  Mild expiratory wheeze. Not in respiratory distress. Chest pain reproducible w/ chest wall palpation.   Additional history obtained:  Additional history obtained from chart review & nursing note review.   EKG: No change from  previous.   Lab Tests:  I Ordered, reviewed, and interpreted labs, which included:  COVID test: Negative.   Imaging Studies ordered:  I ordered imaging studies which included CXR, I independently reviewed, formal radiology impression shows: No active cardiopulmonary disease.  ED Course:  Patient given DuoNeb in the emergency department with improvement in his symptoms.  Lungs clear on reassessment.  No significant increased work of breathing noted.  SPO2 100% on room air.   Patient is low risk Wells, I have a low suspicion for pulmonary embolism.  His chest pain is reproducible with chest wall palpation, EKG without acute ischemic changes.  Chest x-ray without findings of CHF, pneumonia, or pneumothorax.   Plan for treatment with steroids, antihistamines, and muscle relaxant (for chest pain) with PCP follow-up. I discussed results, treatment plan, need for follow-up, and return precautions with the patient. Provided opportunity for questions, patient confirmed understanding and is in agreement with plan.   Portions of this note were generated with Scientist, clinical (histocompatibility and immunogenetics). Dictation errors may occur despite best attempts at proofreading.  Final Clinical Impression(s) / ED Diagnoses Final diagnoses:  Shortness of breath    Rx / DC Orders ED Discharge Orders         Ordered    predniSONE (DELTASONE) 50 MG tablet  Daily with breakfast        07/08/20 1308    cetirizine (ZYRTEC ALLERGY) 10 MG tablet  Daily        07/08/20 1308    methocarbamol (ROBAXIN) 500 MG tablet  Every 8 hours PRN        07/08/20 1308           Adain Geurin, [place] R, PA-C 07/08/20 1311    Horton, Clabe Seal, DO 07/09/20 2010

## 2020-10-16 ENCOUNTER — Encounter (HOSPITAL_COMMUNITY): Payer: Self-pay

## 2020-10-16 ENCOUNTER — Emergency Department (HOSPITAL_COMMUNITY)
Admission: EM | Admit: 2020-10-16 | Discharge: 2020-10-16 | Disposition: A | Discharge status: Home / Self Care | Payer: Self-pay | Attending: Emergency Medicine | Admitting: Emergency Medicine

## 2020-10-16 ENCOUNTER — Emergency Department (HOSPITAL_COMMUNITY): Payer: Self-pay

## 2020-10-16 ENCOUNTER — Other Ambulatory Visit: Payer: Self-pay

## 2020-10-16 DIAGNOSIS — Z87891 Personal history of nicotine dependence: Secondary | ICD-10-CM | POA: Insufficient documentation

## 2020-10-16 DIAGNOSIS — G8929 Other chronic pain: Secondary | ICD-10-CM | POA: Insufficient documentation

## 2020-10-16 DIAGNOSIS — M545 Low back pain, unspecified: Secondary | ICD-10-CM | POA: Insufficient documentation

## 2020-10-16 MED ORDER — LIDOCAINE 5 % EX PTCH: 1.0000 | MEDICATED_PATCH | Freq: Every day | CUTANEOUS | 0 refills | Status: DC | PRN

## 2020-10-16 MED ORDER — NAPROXEN 500 MG PO TABS: 500.0000 mg | ORAL_TABLET | Freq: Two times a day (BID) | ORAL | 0 refills | Status: DC | PRN

## 2020-10-16 MED ORDER — DEXAMETHASONE 4 MG PO TABS
10.0000 mg | ORAL_TABLET | Freq: Once | ORAL | Status: AC
  Administered 2020-10-16: 10 mg via ORAL
  Filled 2020-10-16: qty 3

## 2020-10-16 MED ORDER — METHOCARBAMOL 500 MG PO TABS: 500.0000 mg | ORAL_TABLET | Freq: Three times a day (TID) | ORAL | 0 refills | Status: DC | PRN

## 2020-10-16 MED ORDER — KETOROLAC TROMETHAMINE 15 MG/ML IJ SOLN
30.0000 mg | Freq: Once | INTRAMUSCULAR | Status: AC
  Administered 2020-10-16: 30 mg via INTRAMUSCULAR
  Filled 2020-10-16: qty 2

## 2020-10-16 NOTE — ED Provider Notes (Signed)
[facility] EMERGENCY DEPARTMENT Provider Note   CSN: [id number] Arrival date & time: 10/16/20  0334     History Chief Complaint  Patient presents with   Back Pain    Charles Garza is a 60 y.o. male who presents to the ED with complaints of back pain that is acute on chronic. Patient states he has had problems with back pain for years, lower back, radiates into the posterior thighs bilaterally at times. Worse with certain movements, no alleviating factors. Tylenol was previously helping but has not been anymore recently. Denies numbness, tingling, weakness, saddle anesthesia, incontinence to bowel/bladder, fever, chills, IV drug use, dysuria, or hx of cancer. Patient has not had prior back surgeries.   HPI     History reviewed. No pertinent past medical history.  Patient Active Problem List   Diagnosis Date Noted   Abscess of right hand 10/24/2016   Cellulitis of hand 10/23/2016   Renal insufficiency, mild 10/23/2016   Cellulitis of right hand     Past Surgical History:  Procedure Laterality Date   INCISION AND DRAINAGE ABSCESS Right 10/23/2016   Procedure: INCISION AND DRAINAGE, RIGHT HAND;  Surgeon: Dairl Ponder, MD;  Location: [facility] OR;  Service: Orthopedics;  Laterality: Right;       Family History  Problem Relation Age of Onset   CAD Father    Lung cancer Sister     Social History   Tobacco Use   Smoking status: Former   Smokeless tobacco: Current  Substance Use Topics   Alcohol use: No   Drug use: No    Home Medications Prior to Admission medications   Medication Sig Start Date End Date Taking? Authorizing Provider  cetirizine (ZYRTEC ALLERGY) 10 MG tablet Take 1 tablet (10 mg total) by mouth daily. 07/08/20   Neita Landrigan, Pleas Koch, PA-C  HYDROcodone-acetaminophen (NORCO/VICODIN) 5-325 MG tablet Take 1-2 tablets by mouth every 4 (four) hours as needed for moderate pain. 10/24/16   Narda Bonds, MD  methocarbamol (ROBAXIN) 500 MG  tablet Take 1 tablet (500 mg total) by mouth every 8 (eight) hours as needed for muscle spasms. 07/08/20   Samrat Hayward R, PA-C  naproxen (NAPROSYN) 500 MG tablet Take 1 tablet (500 mg total) by mouth 2 (two) times daily. 01/31/19   Dartha Lodge, PA-C  oxyCODONE-acetaminophen (PERCOCET) 5-325 MG tablet Take 1 tablet by mouth every 6 (six) hours as needed. 01/31/19   Dartha Lodge, PA-C  predniSONE (DELTASONE) 50 MG tablet Take 1 tablet (50 mg total) by mouth daily with breakfast. 07/08/20   Evynn Boutelle R, PA-C  senna-docusate (SENOKOT-S) 8.6-50 MG tablet Take 1 tablet by mouth at bedtime. Take while using narcotic pain medication (Norco/Vicodin) 10/24/16   Narda Bonds, MD    Allergies    Penicillins  Review of Systems   Review of Systems  Constitutional:  Negative for chills, fever and unexpected weight change.  Gastrointestinal:  Negative for abdominal pain, nausea and vomiting.  Genitourinary:  Negative for dysuria.  Musculoskeletal:  Positive for back pain.  Neurological:  Negative for weakness and numbness.       Negative for saddle anesthesia or bowel/bladder incontinence.   All other systems reviewed and are negative.  Physical Exam Updated Vital Signs BP (!) 154/86 (BP Location: Right Arm)   Pulse 76   Temp 97.7 F (36.5 C) (Oral)   Resp 18   Ht 5\' 6"  (1.676 m)   Wt 84.8 kg   SpO2 98%  BMI 30.18 kg/m   Physical Exam Constitutional:      General: He is not in acute distress.    Appearance: He is well-developed. He is not toxic-appearing.  HENT:     Head: Normocephalic and atraumatic.  Cardiovascular:     Rate and Rhythm: Normal rate and regular rhythm.     Comments: 2+ PT pulses.  Pulmonary:     Effort: Pulmonary effort is normal.     Breath sounds: Normal breath sounds.  Abdominal:     General: There is no distension.     Palpations: Abdomen is soft.     Tenderness: There is no abdominal tenderness. There is no guarding or rebound.   Musculoskeletal:     Cervical back: Normal range of motion and neck supple. No spinous process tenderness or muscular tenderness.     Comments: Back: patient diffusely tender throughout lumbar region- midline & bilateral paraspinal muscles. No step off or point/focal vertebral tenderness.  Les: Actively ranging at all major joints. No focal bony tenderness. No calf tenderness.   Skin:    General: Skin is warm and dry.     Findings: No rash.  Neurological:     Mental Status: He is alert.     Deep Tendon Reflexes:     Reflex Scores:      Patellar reflexes are 2+ on the right side and 2+ on the left side.    Comments: Sensation grossly intact to bilateral lower extremities. 5/5 symmetric strength with plantar/dorsiflexion bilaterally. Gait is intact without obvious foot drop.     ED Results / Procedures / Treatments   Labs (all labs ordered are listed, but only abnormal results are displayed) Labs Reviewed - No data to display  EKG None  Radiology DG Lumbar Spine Complete  Result Date: 10/16/2020 CLINICAL DATA:  Back pain radiates down both legs. EXAM: LUMBAR SPINE - COMPLETE 4+ VIEW COMPARISON:  None. FINDINGS: No evidence for an acute fracture. Trace anterolisthesis of L3 on 4 noted. Intervertebral disc spaces are largely preserved throughout. SI joints are unremarkable. Atherosclerotic calcification noted in the abdominal aorta. IMPRESSION: No acute bony abnormality. Aortic Atherosclerois (ICD10-170.0) Electronically Signed   By: Kennith Center M.D.   On: 10/16/2020 05:47    Procedures Procedures   Medications Ordered in ED Medications  dexamethasone (DECADRON) tablet 10 mg (10 mg Oral Given 10/16/20 1218)  ketorolac (TORADOL) 15 MG/ML injection 30 mg (30 mg Intramuscular Given 10/16/20 1217)    ED Course  I have reviewed the triage vital signs and the nursing notes.  Pertinent labs & imaging results that were available during my care of the patient were reviewed by me and  considered in my medical decision making (see chart for details).    MDM Rules/Calculators/A&P                           Patient presents to the ED with complaints of lower back pain.  Nontoxic, resting comfortably, vitals with elevated blood pressure, doubt hypertensive emergency.  Additional history obtained:  Additional history obtained from chart review & nursing note review.   Imaging Studies ordered:  L spine X-ray ordered by triage team, I independently reviewed, formal radiology impression shows: No acute bony abnormality. Aortic Atherosclerosis.   ED Course:  Patient is ambulatory, no focal neurologic deficits, does not seem consistent with acute cord compression or cauda equina syndrome. No urinary symptoms and pain is steady and bilateral therefore feel that  pyelonephritis and nephrolithiasis are less likely.  Symmetric pulses, overall well-appearing, feel that dissection is unlikely.  X-ray without fractures or acute process.  Afebrile without history of IVDU to suggest epidural abscess will treat with Lidoderm patches, naproxen, and Robaxin with PCP/spine specialist follow up. I discussed results, treatment plan, need for follow-up, and return precautions with the patient. Provided opportunity for questions, patient confirmed understanding and is in agreement with plan.   Portions of this note were generated with Scientist, clinical (histocompatibility and immunogenetics). Dictation errors may occur despite best attempts at proofreading.  Final Clinical Impression(s) / ED Diagnoses Final diagnoses:  Bilateral low back pain, unspecified chronicity, unspecified whether sciatica present    Rx / DC Orders ED Discharge Orders          Ordered    naproxen (NAPROSYN) 500 MG tablet  2 times daily PRN        10/16/20 1247    methocarbamol (ROBAXIN) 500 MG tablet  Every 8 hours PRN        10/16/20 1247    lidocaine (LIDODERM) 5 %  Daily PRN        10/16/20 1247             Vander Kueker, [place],  PA-C 10/16/20 1250    Margarita Grizzle, MD 10/26/20 1349

## 2020-10-16 NOTE — ED Triage Notes (Signed)
Pt c/o back pain that radiates down both legs since yesterday. Pt denies injury bur reports chronic back pain.

## 2020-10-16 NOTE — Discharge Instructions (Addendum)
You were seen in the emergency department for back pain today.  Your xray did not show any fractures, it does show some plaque in your arteries- please discuss with primary care.   WE have prescribed you an anti-inflammatory medication and a muscle relaxer.  - Naproxen is a nonsteroidal anti-inflammatory medication that will help with pain and swelling. Be sure to take this medication as prescribed with food, 1 pill every 12 hours,  It should be taken with food, as it can cause stomach upset, and more seriously, stomach bleeding. Do not take other nonsteroidal anti-inflammatory medications with this such as Advil, Motrin, Aleve, Mobic, Goodie Powder, or Motrin.    - Robaxin is the muscle relaxer I have prescribed, this is meant to help with muscle tightness. Be aware that this medication may make you drowsy therefore the first time you take this it should be at a time you are in an environment where you can rest. Do not drive or operate heavy machinery when taking this medication. Do not drink alcohol or take other sedating medications with this medicine such as narcotics or benzodiazepines.   - lidoderm patch-apply 1 patch to area of no significant pain once per day.  Remove and discard patch within 12 hours of application. You make take Tylenol per over the counter dosing with these medications.   We have prescribed you new medication(s) today. Discuss the medications prescribed today with your pharmacist as they can have adverse effects and interactions with your other medicines including over the counter and prescribed medications. Seek medical evaluation if you start to experience new or abnormal symptoms after taking one of these medicines, seek care immediately if you start to experience difficulty breathing, feeling of your throat closing, facial swelling, or rash as these could be indications of a more serious allergic reaction   The application of heat can help soothe the pain.   Follow-up  with primary care or the spine orthopedic group provided in your discharge instructions within 1 week.  However return to the ER should you develop new or worsening symptoms or any other concerns including but not limited to severe or worsening pain, low back pain with fever, numbness, weakness, loss of bowel or bladder control, or inability to walk or urinate, you should return to the ER immediately.

## 2020-10-16 NOTE — ED Provider Notes (Signed)
Emergency Medicine Provider Triage Evaluation Note  Charles Garza , a 60 y.o. male  was evaluated in triage.  Pt complains of pain across his low back. Worse today, but hx of chronic LBP for 5+ years. No recent fall, trauma, injury. Pain radiates to BLE. No fever, incontinence, genital numbness, hx of CA or IVDU. No medications taken PTA, but has used Tylenol and ASA in the past for pain.  Review of Systems  Positive: Back pain Negative: Fever, incontinence  Physical Exam  BP (!) 132/92 (BP Location: Left Arm)   Pulse 88   Temp 97.7 F (36.5 C) (Oral)   Resp 18   Ht 5\' 6"  (1.676 m)   Wt 84.8 kg   SpO2 95%   BMI 30.18 kg/m  Gen:   Awake, no distress   Resp:  Normal effort  MSK:   Moves extremities without difficulty  Other:  Pain with palpation to lumbar paraspinal muscles bilaterally. No bony deformities, step offs, crepitus to the lumbosacral midline.  Medical Decision Making  Medically screening exam initiated at 4:00 AM.  Appropriate orders placed.  Pesach Frisch was informed that the remainder of the evaluation will be completed by another provider, this initial triage assessment does not replace that evaluation, and the importance of remaining in the ED until their evaluation is complete.  Back pain   Eber Jones, PA-C 10/16/20 0402    10/18/20, MD 10/16/20 [phone]

## 2020-10-19 ENCOUNTER — Emergency Department (HOSPITAL_COMMUNITY)
Admission: EM | Admit: 2020-10-19 | Discharge: 2020-10-20 | Disposition: A | Discharge status: Home / Self Care | Payer: Self-pay | Attending: Emergency Medicine | Admitting: Emergency Medicine

## 2020-10-19 DIAGNOSIS — M5441 Lumbago with sciatica, right side: Secondary | ICD-10-CM | POA: Insufficient documentation

## 2020-10-19 DIAGNOSIS — M5442 Lumbago with sciatica, left side: Secondary | ICD-10-CM | POA: Insufficient documentation

## 2020-10-19 DIAGNOSIS — G8929 Other chronic pain: Secondary | ICD-10-CM | POA: Insufficient documentation

## 2020-10-19 DIAGNOSIS — F17290 Nicotine dependence, other tobacco product, uncomplicated: Secondary | ICD-10-CM | POA: Insufficient documentation

## 2020-10-20 ENCOUNTER — Encounter (HOSPITAL_COMMUNITY): Payer: Self-pay

## 2020-10-20 MED ORDER — KETOROLAC TROMETHAMINE 30 MG/ML IJ SOLN
30.0000 mg | Freq: Once | INTRAMUSCULAR | Status: AC
  Administered 2020-10-20: 30 mg via INTRAVENOUS
  Filled 2020-10-20: qty 1

## 2020-10-20 MED ORDER — PREDNISONE 10 MG PO TABS: ORAL_TABLET | ORAL | 0 refills | Status: DC

## 2020-10-20 MED ORDER — METHOCARBAMOL 1000 MG/10ML IJ SOLN
1000.0000 mg | Freq: Once | INTRAVENOUS | Status: AC
  Administered 2020-10-20: 1000 mg via INTRAVENOUS
  Filled 2020-10-20: qty 1000

## 2020-10-20 MED ORDER — OXYCODONE-ACETAMINOPHEN 5-325 MG PO TABS
1.0000 | ORAL_TABLET | Freq: Once | ORAL | Status: AC
  Administered 2020-10-20: 1 via ORAL
  Filled 2020-10-20: qty 1

## 2020-10-20 NOTE — Discharge Instructions (Signed)
1. Medications: robaxin, naproxyn - both already prescribed.  Take both medications 2x per day with food.  Start prednisone taper.  Use lidocaine patch (already prescribed) as needed for additional pain relief., usual home medications 2. Treatment: rest, drink plenty of fluids, gentle stretching as discussed, alternate ice and heat 3. Follow Up: Please followup with orthopedics 3 days for discussion of your diagnoses and further evaluation after today's visit; if you do not have a primary care doctor use the resource guide provided to find one;  Return to the ER for worsening back pain, difficulty walking, loss of bowel or bladder control or other concerning symptoms

## 2020-10-20 NOTE — ED Triage Notes (Signed)
Pt states that he his here for back pain, lower back radiates down both legs, denies injury.

## 2020-10-20 NOTE — ED Provider Notes (Signed)
[facility] EMERGENCY DEPARTMENT Provider Note   CSN: [id number] Arrival date & time: 10/19/20  2245     History Chief Complaint  Patient presents with   Back Pain    Charles Garza is a 60 y.o. male presents to the Emergency Department complaining of gradual, persistent, progressively worsening low back pain which radiates into his buttocks and down the posterior bilateral thighs.  Patient reports he has had chronic back pain for years.  Today's back pain has been ongoing for more than a week.  It is worse with certain movements and nothing makes it better.  Patient reports he was initially taking Tylenol and then switched to ibuprofen after his visit to the emergency department on 10/16/2020.  Denies numbness, tingling, weakness, saddle anesthesia, incontinence of bowel and bladder, fever, chills, IV drug use, dysuria, or history of cancer.  No prior back surgeries.  No known falls or traumas.  Records reviewed.  Patient was prescribed naproxen, lidocaine and Robaxin at the last visit.  He reports he has not been taking those because he does not feel like they are helping.   The history is provided by the patient and medical records. No language interpreter was used.      History reviewed. No pertinent past medical history.  Patient Active Problem List   Diagnosis Date Noted   Abscess of right hand 10/24/2016   Cellulitis of hand 10/23/2016   Renal insufficiency, mild 10/23/2016   Cellulitis of right hand     Past Surgical History:  Procedure Laterality Date   INCISION AND DRAINAGE ABSCESS Right 10/23/2016   Procedure: INCISION AND DRAINAGE, RIGHT HAND;  Surgeon: Dairl Ponder, MD;  Location: [facility] OR;  Service: Orthopedics;  Laterality: Right;       Family History  Problem Relation Age of Onset   CAD Father    Lung cancer Sister     Social History   Tobacco Use   Smoking status: Former   Smokeless tobacco: Current  Substance Use Topics   Alcohol  use: No   Drug use: No    Home Medications Prior to Admission medications   Medication Sig Start Date End Date Taking? Authorizing Provider  predniSONE (DELTASONE) 10 MG tablet Days 1-4 take 4 tablets (40 mg) daily  Days 5-8 take 3 tablets (30 mg) daily, Days 9-11 take 2 tablets (20 mg) daily, Days 12-14 take 1 tablet (10 mg) daily 10/20/20  Yes Kelcy Laible, Dahlia Client, PA-C  cetirizine (ZYRTEC ALLERGY) 10 MG tablet Take 1 tablet (10 mg total) by mouth daily. 07/08/20   Petrucelli, Pleas Koch, PA-C  HYDROcodone-acetaminophen (NORCO/VICODIN) 5-325 MG tablet Take 1-2 tablets by mouth every 4 (four) hours as needed for moderate pain. 10/24/16   Narda Bonds, MD  lidocaine (LIDODERM) 5 % Place 1 patch onto the skin daily as needed. Apply patch to area most significant pain once per day.  Remove and discard patch within 12 hours of application. 10/16/20   Petrucelli, Samantha R, PA-C  methocarbamol (ROBAXIN) 500 MG tablet Take 1 tablet (500 mg total) by mouth every 8 (eight) hours as needed for muscle spasms. 10/16/20   Petrucelli, Samantha R, PA-C  naproxen (NAPROSYN) 500 MG tablet Take 1 tablet (500 mg total) by mouth 2 (two) times daily as needed for moderate pain. 10/16/20   Petrucelli, Samantha R, PA-C  senna-docusate (SENOKOT-S) 8.6-50 MG tablet Take 1 tablet by mouth at bedtime. Take while using narcotic pain medication (Norco/Vicodin) 10/24/16   Narda Bonds, MD  Allergies    Penicillins  Review of Systems   Review of Systems  Constitutional:  Negative for chills and fever.  Respiratory:  Negative for shortness of breath.   Cardiovascular:  Negative for chest pain.  Gastrointestinal:  Negative for abdominal pain.  Endocrine: Negative for polydipsia and polyuria.  Genitourinary:  Negative for dysuria.  Musculoskeletal:  Positive for back pain.  Skin:  Negative for rash and wound.  Allergic/Immunologic: Negative for immunocompromised state.  Neurological:  Negative for headaches.   Hematological:  Does not bruise/bleed easily.  Psychiatric/Behavioral:  Negative for confusion.    Physical Exam Updated Vital Signs BP (!) 145/79 (BP Location: Right Arm)   Pulse 72   Temp (!) 97.3 F (36.3 C) (Oral)   Resp 16   SpO2 95%   Physical Exam Vitals and nursing note reviewed.  Constitutional:      General: He is not in acute distress.    Appearance: He is not diaphoretic.  HENT:     Head: Normocephalic.  Eyes:     General: No scleral icterus.    Conjunctiva/sclera: Conjunctivae normal.  Cardiovascular:     Rate and Rhythm: Normal rate and regular rhythm.     Pulses: Normal pulses.          Radial pulses are 2+ on the right side and 2+ on the left side.  Pulmonary:     Effort: No tachypnea, accessory muscle usage, prolonged expiration, respiratory distress or retractions.     Breath sounds: No stridor.     Comments: Equal chest rise. No increased work of breathing. Abdominal:     General: There is no distension.     Palpations: Abdomen is soft.     Tenderness: There is no abdominal tenderness. There is no guarding or rebound.  Musculoskeletal:     Cervical back: Normal and normal range of motion.     Thoracic back: Normal.     Lumbar back: Tenderness present. No swelling, deformity or bony tenderness. Decreased range of motion.     Comments: Moves all extremities equally and without difficulty.  Ambulatory with steady gait.  Tenderness to palpation across the entirety of the L-spine including paraspinal muscles.  Palpable muscle spasms of the L-spine.  Decreased range of motion due to pain.  Skin:    General: Skin is warm and dry.     Capillary Refill: Capillary refill takes less than 2 seconds.  Neurological:     Mental Status: He is alert.     GCS: GCS eye subscore is 4. GCS verbal subscore is 5. GCS motor subscore is 6.     Comments: Speech is clear and goal oriented. Sensation intact in the bilateral lower extremities.  Strength 5/5 in the bilateral  lower extremities  Psychiatric:        Mood and Affect: Mood normal.    ED Results / Procedures / Treatments    Procedures Procedures   Medications Ordered in ED Medications  oxyCODONE-acetaminophen (PERCOCET/ROXICET) 5-325 MG per tablet 1 tablet (1 tablet Oral Given 10/20/20 0004)  ketorolac (TORADOL) 30 MG/ML injection 30 mg (30 mg Intravenous Given 10/20/20 0535)  methocarbamol (ROBAXIN) 1,000 mg in dextrose 5 % 100 mL IVPB (0 mg Intravenous Stopped 10/20/20 0609)    ED Course  I have reviewed the triage vital signs and the nursing notes.  Pertinent labs & imaging results that were available during my care of the patient were reviewed by me and considered in my medical decision making (see chart  for details).    MDM Rules/Calculators/A&P                           Patient with acute on chronic back pain.  Radicular in nature.  Encouraged use of home medications as prescribed.  We will add prednisone taper.  Patient will need to see orthopedist for further evaluation.    6:15 AM Toradol and Robaxin given here in the emergency department with improvement in pain. Pt comfortable with plan for discharge and Ortho follow-up.   Final Clinical Impression(s) / ED Diagnoses Final diagnoses:  Acute bilateral low back pain with bilateral sciatica    Rx / DC Orders ED Discharge Orders          Ordered    predniSONE (DELTASONE) 10 MG tablet        10/20/20 0553             Reise Gladney, Dahlia Client, PA-C 10/20/20 0315    Glynn Octave, MD 10/20/20 [phone]

## 2020-10-20 NOTE — ED Provider Notes (Signed)
Emergency Medicine Provider Triage Evaluation Note  Charles Garza , a 60 y.o. male  was evaluated in triage.  Pt complains of ongoing, nontraumatic low back pain.  Was seen on 10/16/2020 for back pain.  Plain films at that time were within normal limits.  Patient afebrile here.  Denies IV drug use.  Per the record was discharged home with Lidoderm patches, naproxen and Robaxin.  Patient reports he has not been using these because they do not help.  Review of Systems  Positive: Low back pain with radiation into the bilateral hips and thighs Negative: Numbness, tingling or weakness  Physical Exam  BP (!) 146/100 (BP Location: Right Arm)   Pulse 87   Temp (!) 97.3 F (36.3 C) (Oral)   Resp 20   SpO2 97%  Gen:   Awake, no distress   Resp:  Normal effort  MSK:   Moves extremities without difficulty, ambulates with steady gait Other:  Tenderness to palpation along the paraspinal muscles of the L-spine with palpable spasm  Medical Decision Making  Medically screening exam initiated at 12:02 AM.  Appropriate orders placed.  Charles Garza was informed that the remainder of the evaluation will be completed by another provider, this initial triage assessment does not replace that evaluation, and the importance of remaining in the ED until their evaluation is complete.  Nontraumatic back pain.  Percocet given in triage.   Charles Garza, Charles Garza 10/20/20 0004    Charles Octave, MD 10/20/20 [phone]

## 2020-11-03 ENCOUNTER — Encounter (HOSPITAL_COMMUNITY): Payer: Self-pay | Admitting: Radiology

## 2020-11-03 ENCOUNTER — Emergency Department (HOSPITAL_COMMUNITY)
Admission: EM | Admit: 2020-11-03 | Discharge: 2020-11-03 | Disposition: A | Discharge status: Home / Self Care | Payer: Self-pay | Attending: Emergency Medicine | Admitting: Emergency Medicine

## 2020-11-03 ENCOUNTER — Other Ambulatory Visit: Payer: Self-pay

## 2020-11-03 ENCOUNTER — Emergency Department (HOSPITAL_COMMUNITY): Payer: Self-pay

## 2020-11-03 DIAGNOSIS — R350 Frequency of micturition: Secondary | ICD-10-CM | POA: Insufficient documentation

## 2020-11-03 DIAGNOSIS — F17220 Nicotine dependence, chewing tobacco, uncomplicated: Secondary | ICD-10-CM | POA: Insufficient documentation

## 2020-11-03 DIAGNOSIS — R1110 Vomiting, unspecified: Secondary | ICD-10-CM | POA: Insufficient documentation

## 2020-11-03 DIAGNOSIS — R1084 Generalized abdominal pain: Secondary | ICD-10-CM | POA: Insufficient documentation

## 2020-11-03 LAB — COMPREHENSIVE METABOLIC PANEL
ALT: 14 U/L (ref 0–44)
AST: 22 U/L (ref 15–41)
Albumin: 3.9 g/dL (ref 3.5–5.0)
Alkaline Phosphatase: 86 U/L (ref 38–126)
Anion gap: 8 (ref 5–15)
BUN: 29 mg/dL — ABNORMAL HIGH (ref 6–20)
CO2: 23 mmol/L (ref 22–32)
Calcium: 9.1 mg/dL (ref 8.9–10.3)
Chloride: 102 mmol/L (ref 98–111)
Creatinine, Ser: 1.58 mg/dL — ABNORMAL HIGH (ref 0.61–1.24)
GFR, Estimated: 50 mL/min — ABNORMAL LOW (ref >60)
Glucose, Bld: 102 mg/dL — ABNORMAL HIGH (ref 70–99)
Potassium: 4.2 mmol/L (ref 3.5–5.1)
Sodium: 133 mmol/L — ABNORMAL LOW (ref 135–145)
Total Bilirubin: 0.3 mg/dL (ref 0.3–1.2)
Total Protein: 7 g/dL (ref 6.5–8.1)

## 2020-11-03 LAB — CBC WITH DIFFERENTIAL/PLATELET
Abs Immature Granulocytes: 0.04 10*3/uL (ref 0.00–0.07)
Basophils Absolute: 0.1 10*3/uL (ref 0.0–0.1)
Basophils Relative: 1 %
Eosinophils Absolute: 0.6 10*3/uL — ABNORMAL HIGH (ref 0.0–0.5)
Eosinophils Relative: 7 %
HCT: 46.2 % (ref 39.0–52.0)
Hemoglobin: 16 g/dL (ref 13.0–17.0)
Immature Granulocytes: 1 %
Lymphocytes Relative: 33 %
Lymphs Abs: 2.9 10*3/uL (ref 0.7–4.0)
MCH: 31.7 pg (ref 26.0–34.0)
MCHC: 34.6 g/dL (ref 30.0–36.0)
MCV: 91.7 fL (ref 80.0–100.0)
Monocytes Absolute: 0.9 10*3/uL (ref 0.1–1.0)
Monocytes Relative: 10 %
Neutro Abs: 4.3 10*3/uL (ref 1.7–7.7)
Neutrophils Relative %: 48 %
Platelets: 262 10*3/uL (ref 150–400)
RBC: 5.04 MIL/uL (ref 4.22–5.81)
RDW: 12.8 % (ref 11.5–15.5)
WBC: 8.8 10*3/uL (ref 4.0–10.5)
nRBC: 0 % (ref 0.0–0.2)

## 2020-11-03 LAB — URINALYSIS, ROUTINE W REFLEX MICROSCOPIC
Bilirubin Urine: NEGATIVE
Glucose, UA: NEGATIVE mg/dL
Ketones, ur: NEGATIVE mg/dL
Nitrite: NEGATIVE
Protein, ur: NEGATIVE mg/dL
Specific Gravity, Urine: 1.023 (ref 1.005–1.030)
pH: 5 (ref 5.0–8.0)

## 2020-11-03 LAB — LIPASE, BLOOD: Lipase: 33 U/L (ref 11–51)

## 2020-11-03 MED ORDER — SULFAMETHOXAZOLE-TRIMETHOPRIM 800-160 MG PO TABS: 1.0000 | ORAL_TABLET | Freq: Two times a day (BID) | ORAL | 0 refills | Status: AC

## 2020-11-03 MED ORDER — IOHEXOL 300 MG/ML  SOLN
100.0000 mL | Freq: Once | INTRAMUSCULAR | Status: AC | PRN
  Administered 2020-11-03: 100 mL via INTRAVENOUS

## 2020-11-03 NOTE — ED Provider Notes (Signed)
Emergency Medicine Provider Triage Evaluation Note  Charles Garza , a 60 y.o. male  was evaluated in triage.  Pt complains of constant abdominal pain and swelling worsening over the week. Last bowel movement Sunday. 2 episodes of non-bloody emesis today. No surgeries on belly.   Review of Systems  Positive: Abd pain, NV,  Negative: Constipation, diarrhea  Physical Exam  BP 134/82 (BP Location: Left Arm)   Pulse 95   Temp 98 F (36.7 C) (Oral)   Resp 18   Ht 5\' 6"  (1.676 m)   Wt 84.8 kg   SpO2 99%   BMI 30.18 kg/m  Gen:   Awake, no distress   Resp:  Normal effort  Abd:  Distended and diffusely tender.  MSK:   Moves extremities without difficulty  Other:    Medical Decision Making  Medically screening exam initiated at 1:18 AM.  Appropriate orders placed.  Alyssa Mancera was informed that the remainder of the evaluation will be completed by another provider, this initial triage assessment does not replace that evaluation, and the importance of remaining in the ED until their evaluation is complete.    Eber Jones, PA-C 11/03/20 0119    01/03/21, MD 11/03/20 [phone]

## 2020-11-03 NOTE — ED Triage Notes (Signed)
Pt reports abdominal pain with bloating as well as n/v

## 2020-11-03 NOTE — ED Notes (Signed)
Patient verbalizes understanding of discharge instructions. Opportunity for questioning and answers were provided. Armband removed by staff, pt discharged from ED and ambulate to lobby to return home with friend.

## 2020-11-03 NOTE — ED Provider Notes (Signed)
[facility] EMERGENCY DEPARTMENT Provider Note   CSN: [id number] Arrival date & time: 11/03/20  0100     History Chief Complaint  Patient presents with   Abdominal Pain    Charles Garza is a 60 y.o. male.  Patient complaining of diffuse abdominal pain bloating and vomiting 2 days ago nonbloody nonbilious.  He states that otherwise he had a normal bowel movement.  Denies any fevers or cough.  He had increased urinary frequency for the past 2 to 3 days as well.      History reviewed. No pertinent past medical history.  Patient Active Problem List   Diagnosis Date Noted   Abscess of right hand 10/24/2016   Cellulitis of hand 10/23/2016   Renal insufficiency, mild 10/23/2016   Cellulitis of right hand     Past Surgical History:  Procedure Laterality Date   INCISION AND DRAINAGE ABSCESS Right 10/23/2016   Procedure: INCISION AND DRAINAGE, RIGHT HAND;  Surgeon: Dairl Ponder, MD;  Location: [facility] OR;  Service: Orthopedics;  Laterality: Right;       Family History  Problem Relation Age of Onset   CAD Father    Lung cancer Sister     Social History   Tobacco Use   Smoking status: Former   Smokeless tobacco: Current  Substance Use Topics   Alcohol use: No   Drug use: No    Home Medications Prior to Admission medications   Medication Sig Start Date End Date Taking? Authorizing Provider  sulfamethoxazole-trimethoprim (BACTRIM DS) 800-160 MG tablet Take 1 tablet by mouth 2 (two) times daily for 7 days. 11/03/20 11/10/20 Yes Cheryll Cockayne, MD  cetirizine (ZYRTEC ALLERGY) 10 MG tablet Take 1 tablet (10 mg total) by mouth daily. 07/08/20   Petrucelli, Pleas Koch, PA-C  HYDROcodone-acetaminophen (NORCO/VICODIN) 5-325 MG tablet Take 1-2 tablets by mouth every 4 (four) hours as needed for moderate pain. 10/24/16   Narda Bonds, MD  lidocaine (LIDODERM) 5 % Place 1 patch onto the skin daily as needed. Apply patch to area most significant pain once per day.   Remove and discard patch within 12 hours of application. 10/16/20   Petrucelli, Samantha R, PA-C  methocarbamol (ROBAXIN) 500 MG tablet Take 1 tablet (500 mg total) by mouth every 8 (eight) hours as needed for muscle spasms. 10/16/20   Petrucelli, Samantha R, PA-C  naproxen (NAPROSYN) 500 MG tablet Take 1 tablet (500 mg total) by mouth 2 (two) times daily as needed for moderate pain. 10/16/20   Petrucelli, Samantha R, PA-C  predniSONE (DELTASONE) 10 MG tablet Days 1-4 take 4 tablets (40 mg) daily  Days 5-8 take 3 tablets (30 mg) daily, Days 9-11 take 2 tablets (20 mg) daily, Days 12-14 take 1 tablet (10 mg) daily 10/20/20   Muthersbaugh, Dahlia Client, PA-C  senna-docusate (SENOKOT-S) 8.6-50 MG tablet Take 1 tablet by mouth at bedtime. Take while using narcotic pain medication (Norco/Vicodin) 10/24/16   Narda Bonds, MD    Allergies    Penicillins  Review of Systems   Review of Systems  Constitutional:  Negative for fever.  HENT:  Negative for ear pain and sore throat.   Eyes:  Negative for pain.  Respiratory:  Negative for cough.   Cardiovascular:  Negative for chest pain.  Gastrointestinal:  Positive for abdominal pain.  Genitourinary:  Negative for flank pain.  Musculoskeletal:  Negative for back pain.  Skin:  Negative for color change and rash.  Neurological:  Negative for syncope.  All  other systems reviewed and are negative.  Physical Exam Updated Vital Signs BP (!) 141/72   Pulse 85   Temp 97.8 F (36.6 C)   Resp 15   Ht 5\' 6"  (1.676 m)   Wt 84.8 kg   SpO2 98%   BMI 30.18 kg/m   Physical Exam Constitutional:      Appearance: He is well-developed.  HENT:     Head: Normocephalic.     Nose: Nose normal.  Eyes:     Extraocular Movements: Extraocular movements intact.  Cardiovascular:     Rate and Rhythm: Normal rate.  Pulmonary:     Effort: Pulmonary effort is normal.  Abdominal:     Tenderness: There is abdominal tenderness.     Comments: Diffuse moderate abdominal  tenderness.  No guarding or rebound noted.  Skin:    Coloration: Skin is not jaundiced.  Neurological:     Mental Status: He is alert. Mental status is at baseline.    ED Results / Procedures / Treatments   Labs (all labs ordered are listed, but only abnormal results are displayed) Labs Reviewed  URINALYSIS, ROUTINE W REFLEX MICROSCOPIC - Abnormal; Notable for the following components:      Result Value   APPearance HAZY (*)    Hgb urine dipstick SMALL (*)    Leukocytes,Ua SMALL (*)    Bacteria, UA RARE (*)    All other components within normal limits  COMPREHENSIVE METABOLIC PANEL - Abnormal; Notable for the following components:   Sodium 133 (*)    Glucose, Bld 102 (*)    BUN 29 (*)    Creatinine, Ser 1.58 (*)    GFR, Estimated 50 (*)    All other components within normal limits  CBC WITH DIFFERENTIAL/PLATELET - Abnormal; Notable for the following components:   Eosinophils Absolute 0.6 (*)    All other components within normal limits  LIPASE, BLOOD    EKG None  Radiology CT Abdomen Pelvis W Contrast  Result Date: 11/03/2020 CLINICAL DATA:  Diffuse abdominal pain with nausea and vomiting EXAM: CT ABDOMEN AND PELVIS WITH CONTRAST TECHNIQUE: Multidetector CT imaging of the abdomen and pelvis was performed using the standard protocol following bolus administration of intravenous contrast. CONTRAST:  01/03/2021 OMNIPAQUE IOHEXOL 300 MG/ML  SOLN COMPARISON:  None. FINDINGS: Lower chest: No acute abnormality. Hepatobiliary: Probable area of focal fatty infiltration along the falciform ligament. Otherwise, no focal liver abnormality. Gallbladder is unremarkable. No hyperdense gallstone. No biliary dilatation. Pancreas: Unremarkable. No pancreatic ductal dilatation or surrounding inflammatory changes. Spleen: Normal in size without focal abnormality. Adrenals/Urinary Tract: Unremarkable adrenal glands. Kidneys enhance symmetrically. No renal lesion, stone, or hydronephrosis. Circumferential  thickening of the urinary bladder. Stomach/Bowel: Stomach is within normal limits. Appendix appears normal (series 3, image 53). No evidence of bowel wall thickening, distention, or inflammatory changes. Vascular/Lymphatic: Prominent atherosclerotic calcifications throughout the aortoiliac axis. No aneurysm. No abdominopelvic lymphadenopathy. Reproductive: Prostate is unremarkable. Other: No free fluid. No abdominopelvic fluid collection. No pneumoperitoneum. No abdominal wall hernia. Musculoskeletal: Facet predominant lower lumbar spondylosis. Rounded 8 mm calcification along the left side of the canal at the L3-4 level, which could represent a calcified facet synovial cyst or possibly sequestered disc fragment (series 3, image 38). This likely contributes to a degree of canal stenosis at this level. IMPRESSION: 1. Circumferential thickening of the urinary bladder wall, which may represent cystitis. Correlation with urinalysis is recommended. 2. Rounded 8 mm calcification along the left side of the canal at the L3-4 level,  which could represent a calcified facet synovial cyst or possibly sequestered disc fragment. This likely contributes to a degree of canal stenosis at this level. This could be further evaluated with nonemergent MRI as clinically indicated based on patient's symptoms. Aortic Atherosclerosis (ICD10-I70.0). Electronically Signed   By: Duanne Guess D.O.   On: 11/03/2020 10:03    Procedures Procedures   Medications Ordered in ED Medications  iohexol (OMNIPAQUE) 300 MG/ML solution 100 mL (100 mLs Intravenous Contrast Given 11/03/20 0930)    ED Course  I have reviewed the triage vital signs and the nursing notes.  Pertinent labs & imaging results that were available during my care of the patient were reviewed by me and considered in my medical decision making (see chart for details).    MDM Rules/Calculators/A&P                           Labs are unremarkable.  CT imaging shows  some inflammation of the bladder and calcification along the lumbar spine.  Patient advised to follow-up with his primary care doctor regarding these findings.  However he has no back pain complaints at this time.  Will advise immediate return for fevers worsening symptoms or any additional concerns otherwise advised him to call his doctor for follow-up appointment within the week.   Final Clinical Impression(s) / ED Diagnoses Final diagnoses:  Generalized abdominal pain    Rx / DC Orders ED Discharge Orders          Ordered    sulfamethoxazole-trimethoprim (BACTRIM DS) 800-160 MG tablet  2 times daily        11/03/20 1217             Cheryll Cockayne, MD 11/03/20 1217

## 2020-11-03 NOTE — ED Notes (Signed)
Patient transported to CT 

## 2020-11-03 NOTE — Discharge Instructions (Addendum)
There was inflammation around your bladder and region of calcification along your spine.  You will need to follow-up with your primary care doctor regarding these findings.  Call them tomorrow to schedule an appointment within the next week or so.  Return to the ER if you have worsening symptoms, fevers, or any additional concerns.

## 2020-12-10 ENCOUNTER — Emergency Department (HOSPITAL_COMMUNITY)
Admission: EM | Admit: 2020-12-10 | Discharge: 2020-12-10 | Disposition: A | Discharge status: Home / Self Care | Payer: Self-pay | Attending: Emergency Medicine | Admitting: Emergency Medicine

## 2020-12-10 ENCOUNTER — Encounter (HOSPITAL_COMMUNITY): Payer: Self-pay | Admitting: Emergency Medicine

## 2020-12-10 ENCOUNTER — Encounter (HOSPITAL_COMMUNITY): Payer: Self-pay

## 2020-12-10 ENCOUNTER — Other Ambulatory Visit: Payer: Self-pay

## 2020-12-10 DIAGNOSIS — K047 Periapical abscess without sinus: Secondary | ICD-10-CM | POA: Insufficient documentation

## 2020-12-10 DIAGNOSIS — Z87891 Personal history of nicotine dependence: Secondary | ICD-10-CM | POA: Insufficient documentation

## 2020-12-10 DIAGNOSIS — K0889 Other specified disorders of teeth and supporting structures: Secondary | ICD-10-CM | POA: Insufficient documentation

## 2020-12-10 DIAGNOSIS — R599 Enlarged lymph nodes, unspecified: Secondary | ICD-10-CM | POA: Insufficient documentation

## 2020-12-10 DIAGNOSIS — R030 Elevated blood-pressure reading, without diagnosis of hypertension: Secondary | ICD-10-CM | POA: Insufficient documentation

## 2020-12-10 MED ORDER — NAPROXEN 500 MG PO TABS: 500.0000 mg | ORAL_TABLET | Freq: Two times a day (BID) | ORAL | 0 refills | Status: DC

## 2020-12-10 MED ORDER — CLINDAMYCIN HCL 300 MG PO CAPS
300.0000 mg | ORAL_CAPSULE | Freq: Once | ORAL | Status: AC
  Administered 2020-12-10: 300 mg via ORAL
  Filled 2020-12-10: qty 1

## 2020-12-10 MED ORDER — HYDROCODONE-ACETAMINOPHEN 5-325 MG PO TABS
1.0000 | ORAL_TABLET | Freq: Once | ORAL | Status: AC
  Administered 2020-12-10: 1 via ORAL
  Filled 2020-12-10: qty 1

## 2020-12-10 MED ORDER — CLINDAMYCIN HCL 150 MG PO CAPS: 300.0000 mg | ORAL_CAPSULE | Freq: Three times a day (TID) | ORAL | 0 refills | Status: DC

## 2020-12-10 NOTE — ED Triage Notes (Signed)
Per EMS, pt has dental pain, was eating fish and he bit into a fish bone and it got stuck.

## 2020-12-10 NOTE — Care Management (Signed)
MATCH letter sent to [facility] on [address], Attempted to contact patient at number listed in chart to update him unable to LVM.

## 2020-12-10 NOTE — ED Provider Notes (Signed)
[name] EMERGENCY DEPARTMENT Provider Note   CSN: [id number] Arrival date & time: 12/10/20  0128     History Chief Complaint  Patient presents with   Dental Pain    Charles Garza is a 60 y.o. male.  The history is provided by the patient and medical records.  Dental Pain  60 y.o. M here with right lower dental pain.  Has chronic issues but worse after eating fish with bones in it yesterday.  States he has had worsening pain today along gums.  No facial swelling or fever.  Does not currently have dentist.  Taken motrin and BC powder without relief.  History reviewed. No pertinent past medical history.  Patient Active Problem List   Diagnosis Date Noted   Abscess of right hand 10/24/2016   Cellulitis of hand 10/23/2016   Renal insufficiency, mild 10/23/2016   Cellulitis of right hand     Past Surgical History:  Procedure Laterality Date   INCISION AND DRAINAGE ABSCESS Right 10/23/2016   Procedure: INCISION AND DRAINAGE, RIGHT HAND;  Surgeon: Dairl Ponder, MD;  Location: [facility] OR;  Service: Orthopedics;  Laterality: Right;       Family History  Problem Relation Age of Onset   CAD Father    Lung cancer Sister     Social History   Tobacco Use   Smoking status: Former   Smokeless tobacco: Current  Substance Use Topics   Alcohol use: No   Drug use: No    Home Medications Prior to Admission medications   Medication Sig Start Date End Date Taking? Authorizing Provider  clindamycin (CLEOCIN) 150 MG capsule Take 2 capsules (300 mg total) by mouth 3 (three) times daily. May dispense as 150mg  capsules 12/10/20  Yes 12/12/20, Allyne Gee, PA-C  naproxen (NAPROSYN) 500 MG tablet Take 1 tablet (500 mg total) by mouth 2 (two) times daily with a meal. 12/10/20  Yes 12/12/20, PA-C  cetirizine (ZYRTEC ALLERGY) 10 MG tablet Take 1 tablet (10 mg total) by mouth daily. 07/08/20   Petrucelli, 07/10/20, PA-C  HYDROcodone-acetaminophen (NORCO/VICODIN) 5-325  MG tablet Take 1-2 tablets by mouth every 4 (four) hours as needed for moderate pain. 10/24/16   10/26/16, MD  lidocaine (LIDODERM) 5 % Place 1 patch onto the skin daily as needed. Apply patch to area most significant pain once per day.  Remove and discard patch within 12 hours of application. 10/16/20   Petrucelli, Samantha R, PA-C  methocarbamol (ROBAXIN) 500 MG tablet Take 1 tablet (500 mg total) by mouth every 8 (eight) hours as needed for muscle spasms. 10/16/20   Petrucelli, Samantha R, PA-C  predniSONE (DELTASONE) 10 MG tablet Days 1-4 take 4 tablets (40 mg) daily  Days 5-8 take 3 tablets (30 mg) daily, Days 9-11 take 2 tablets (20 mg) daily, Days 12-14 take 1 tablet (10 mg) daily 10/20/20   Muthersbaugh, 10/22/20, PA-C  senna-docusate (SENOKOT-S) 8.6-50 MG tablet Take 1 tablet by mouth at bedtime. Take while using narcotic pain medication (Norco/Vicodin) 10/24/16   10/26/16, MD    Allergies    Penicillins  Review of Systems   Review of Systems  HENT:  Positive for dental problem.   All other systems reviewed and are negative.  Physical Exam Updated Vital Signs BP (!) 165/107   Pulse 92   Temp 98.4 F (36.9 C) (Oral)   Resp 16   SpO2 98%   Physical Exam Vitals and nursing note reviewed.  Constitutional:  Appearance: He is well-developed.  HENT:     Head: Normocephalic and atraumatic.     Mouth/Throat:     Comments: Teeth largely in very poor dentition,  numerous teeth are missing, right lower premolar is broken and flush with gumline with central decay, surrounding gingiva swollen and inflamed without discrete abscess, handling secretions appropriately, no trismus, no facial or neck swelling, normal phonation without stridor Eyes:     Conjunctiva/sclera: Conjunctivae normal.     Pupils: Pupils are equal, round, and reactive to light.  Cardiovascular:     Rate and Rhythm: Normal rate and regular rhythm.     Heart sounds: Normal heart sounds.  Pulmonary:      Effort: Pulmonary effort is normal.     Breath sounds: Normal breath sounds.  Abdominal:     General: Bowel sounds are normal.     Palpations: Abdomen is soft.  Musculoskeletal:        General: Normal range of motion.     Cervical back: Normal range of motion.  Skin:    General: Skin is warm and dry.  Neurological:     Mental Status: He is alert and oriented to person, place, and time.    ED Results / Procedures / Treatments   Labs (all labs ordered are listed, but only abnormal results are displayed) Labs Reviewed - No data to display  EKG None  Radiology No results found.  Procedures Procedures   Medications Ordered in ED Medications - No data to display  ED Course  I have reviewed the triage vital signs and the nursing notes.  Pertinent labs & imaging results that were available during my care of the patient were reviewed by me and considered in my medical decision making (see chart for details).    MDM Rules/Calculators/A&P                           60 y.o. M here with dental pain.  Has generally poor dentition with concern for acute infection of right lower premolar.  No facial or neck swleling, handling secretions well.  No signs/symptoms concerning for ludwig's angina.  Will start on clindamycin given his penicilin allergy and refer to dentist for follow-up.  Can return here for new concerns.  Final Clinical Impression(s) / ED Diagnoses Final diagnoses:  Dental infection    Rx / DC Orders ED Discharge Orders          Ordered    clindamycin (CLEOCIN) 150 MG capsule  3 times daily        12/10/20 0148    naproxen (NAPROSYN) 500 MG tablet  2 times daily with meals        12/10/20 0148             Garlon Hatchet, PA-C 12/10/20 0150    [name], MD 12/10/20 [phone]

## 2020-12-10 NOTE — Discharge Instructions (Addendum)
If you are drooling and unable to swallow, not that it is simply painful to swallow, you develop fevers, or have other concerns please seek additional medical care and evaluation.  Your blood pressure was elevated today.  Please follow-up with your primary care doctor as you most likely need medicines to address this.  If you do not have a primary care doctor then you can follow-up with the wellness clinic.  I have given you their information.  If you do not treat high blood pressure it can cause permanent and long-term damage even if you are not feeling any symptoms.    Please take Ibuprofen (Advil, motrin) and Tylenol (acetaminophen) to relieve your pain.    You may take up to 600 MG (3 pills) of normal strength ibuprofen every 8 hours as needed.   You make take tylenol, up to 1,000 mg (two extra strength pills) every 8 hours as needed.   It is safe to take ibuprofen and tylenol at the same time as they work differently.   Do not take more than 3,000 mg tylenol in a 24 hour period (not more than one dose every 8 hours.  Please check all medication labels as many medications such as pain and cold medications may contain tylenol.  Do not drink alcohol while taking these medications.  Do not take other NSAID'S while taking ibuprofen (such as aleve or naproxen).  Please take ibuprofen with food to decrease stomach upset.  Today you received medications that may make you sleepy or impair your ability to make decisions.  For the next 24 hours please do not drive, operate heavy machinery, care for a small child with out another adult present, or perform any activities that may cause harm to you or someone else if you were to fall asleep or be impaired.   You may have diarrhea from the antibiotics.  It is very important that you continue to take the antibiotics even if you get diarrhea unless a medical professional tells you that you may stop taking them.  If you stop too early the bacteria you are being  treated for will become stronger and you may need different, more powerful antibiotics that have more side effects and worsening diarrhea.  Please stay well hydrated and consider probiotics as they may decrease the severity of your diarrhea.

## 2020-12-10 NOTE — ED Triage Notes (Signed)
Pt presents with c/o dental pain. Pt was seen at [facility] last night and reports because of getting paid, he cannot get the antibiotics they prescribed him until next week and he does not feel he will make it until then because of the pain.

## 2020-12-10 NOTE — Progress Notes (Signed)
CSW spoke with RN CM regarding request for medication assistance.  Edwin Dada, MSW, LCSW Transitions of Care  Clinical Social Worker II [phone]

## 2020-12-10 NOTE — Care Management (Signed)
  [facility] Medication Assistance Card Name: [name] ID (MRN): [id number] Bin: [id number] RX Group: [id number] Discharge Date: 12/10/2020 Expiration Date: 12/21/2020                                           (must be filled within 7 days of discharge)         Dear   : Charles Garza, Charles Garza  You have been approved to have the prescriptions written by your discharging physician filled through our [facility] (Medication Assistance Through [facility]) program. This program allows for a one-time (no refills) 34-day supply of selected medications for a low copay amount.  The copay is $3.00 per prescription. For instance, if you have one prescription, you will pay $3.00; for two prescriptions, you pay $6.00; for three prescriptions, you pay $9.00; and so on.  Only certain pharmacies are participating in this program with [facility]. You will need to select one of the pharmacies from the attached list and take your prescriptions, this letter, and your photo ID to one of the participating pharmacies.   We are excited that you are able to use the [facility] program to get your medications. These prescriptions must be filled within 7 days of hospital discharge or they will no longer be valid for the [facility] program. Should you have any problems with your prescriptions please contact your case management team member at [phone] for [place].  Thank you, [facility] Health Care Management

## 2020-12-10 NOTE — Discharge Instructions (Addendum)
Take the prescribed medication as directed. Follow-up with dentist-- can use our doctor on call or one of the clinics on resource guide. Return to the ED for new or worsening symptoms.

## 2020-12-10 NOTE — ED Provider Notes (Signed)
[facility]-EMERGENCY DEPT Provider Note   CSN: [id number] Arrival date & time: 12/10/20  1216     History Chief Complaint  Patient presents with   Dental Pain    Charles Garza is a 60 y.o. male who presents today for dental pain. He reports that on Tuesday he had a fish bone get stuck in the area of the tooth.  He was seen at [facility] last night and was given rx for antibiotics and was unable to get them filled and is unable to until last week.  He took an aspirin earlier today with out relief.    HPI     History reviewed. No pertinent past medical history.  Patient Active Problem List   Diagnosis Date Noted   Abscess of right hand 10/24/2016   Cellulitis of hand 10/23/2016   Renal insufficiency, mild 10/23/2016   Cellulitis of right hand     Past Surgical History:  Procedure Laterality Date   INCISION AND DRAINAGE ABSCESS Right 10/23/2016   Procedure: INCISION AND DRAINAGE, RIGHT HAND;  Surgeon: Dairl Ponder, MD;  Location: [facility] OR;  Service: Orthopedics;  Laterality: Right;       Family History  Problem Relation Age of Onset   CAD Father    Lung cancer Sister     Social History   Tobacco Use   Smoking status: Former   Smokeless tobacco: Current  Substance Use Topics   Alcohol use: No   Drug use: No    Home Medications Prior to Admission medications   Medication Sig Start Date End Date Taking? Authorizing Provider  cetirizine (ZYRTEC ALLERGY) 10 MG tablet Take 1 tablet (10 mg total) by mouth daily. 07/08/20   Petrucelli, Samantha R, PA-C  clindamycin (CLEOCIN) 150 MG capsule Take 2 capsules (300 mg total) by mouth 3 (three) times daily. May dispense as 150mg  capsules 12/10/20   12/12/20, PA-C  HYDROcodone-acetaminophen (NORCO/VICODIN) 5-325 MG tablet Take 1-2 tablets by mouth every 4 (four) hours as needed for moderate pain. 10/24/16   10/26/16, MD  lidocaine (LIDODERM) 5 % Place 1 patch onto the skin daily as needed.  Apply patch to area most significant pain once per day.  Remove and discard patch within 12 hours of application. 10/16/20   Petrucelli, Samantha R, PA-C  methocarbamol (ROBAXIN) 500 MG tablet Take 1 tablet (500 mg total) by mouth every 8 (eight) hours as needed for muscle spasms. 10/16/20   Petrucelli, 10/18/20 R, PA-C  naproxen (NAPROSYN) 500 MG tablet Take 1 tablet (500 mg total) by mouth 2 (two) times daily with a meal. 12/10/20   12/12/20, Allyne Gee, PA-C  predniSONE (DELTASONE) 10 MG tablet Days 1-4 take 4 tablets (40 mg) daily  Days 5-8 take 3 tablets (30 mg) daily, Days 9-11 take 2 tablets (20 mg) daily, Days 12-14 take 1 tablet (10 mg) daily 10/20/20   Muthersbaugh, 10/22/20, PA-C  senna-docusate (SENOKOT-S) 8.6-50 MG tablet Take 1 tablet by mouth at bedtime. Take while using narcotic pain medication (Norco/Vicodin) 10/24/16   10/26/16, MD    Allergies    Penicillins  Review of Systems   Review of Systems  Constitutional:  Negative for chills and fever.  HENT:  Positive for dental problem. Negative for drooling, facial swelling, rhinorrhea, sinus pressure, sore throat, trouble swallowing and voice change.   Eyes:  Negative for pain.  Respiratory:  Negative for chest tightness and shortness of breath.   Cardiovascular:  Negative for chest  pain.  Neurological:  Negative for weakness and headaches.  All other systems reviewed and are negative.  Physical Exam Updated Vital Signs BP (!) 169/88 (BP Location: Left Arm)   Pulse 84   Temp 97.7 F (36.5 C) (Oral)   Resp 16   SpO2 97%   Physical Exam Vitals and nursing note reviewed.  Constitutional:      General: He is not in acute distress.    Appearance: He is not ill-appearing or toxic-appearing.  HENT:     Head: Normocephalic and atraumatic.     Mouth/Throat:     Pharynx: Oropharynx is clear.     Comments: There is no trismus.  No elevation of the floor the mouth.  Uvula is midline.  Normal phonation.  Remaining teeth are in very  poor state with multiple broken, carious and missing teeth.  There is no obvious intraoral edema. Eyes:     Conjunctiva/sclera: Conjunctivae normal.  Cardiovascular:     Rate and Rhythm: Normal rate.  Pulmonary:     Effort: Pulmonary effort is normal. No respiratory distress.  Musculoskeletal:     Cervical back: No rigidity.  Lymphadenopathy:     Cervical: Cervical adenopathy (Mild, right-sided) present.  Neurological:     Mental Status: He is alert. Mental status is at baseline.     Comments: Awake and alert, answers all questions appropriately.  Speech is not slurred.    Psychiatric:        Mood and Affect: Mood normal.    ED Results / Procedures / Treatments   Labs (all labs ordered are listed, but only abnormal results are displayed) Labs Reviewed - No data to display  EKG None  Radiology No results found.  Procedures Procedures   Medications Ordered in ED Medications  clindamycin (CLEOCIN) capsule 300 mg (300 mg Oral Given 12/10/20 1340)  HYDROcodone-acetaminophen (NORCO/VICODIN) 5-325 MG per tablet 1 tablet (1 tablet Oral Given 12/10/20 1340)    ED Course  I have reviewed the triage vital signs and the nursing notes.  Pertinent labs & imaging results that were available during my care of the patient were reviewed by me and considered in my medical decision making (see chart for details).    MDM Rules/Calculators/A&P                          Patient is a 60 year old man who presents today for evaluation of ongoing dental pain.  He was seen for the same and given a prescription for antibiotics except he states he is unable to afford them until next week.  He has not had any significant changes.  Here he is given a Vicodin for his pain and first dose of clindamycin. TOC is consulted and was able to assist in medications which is much appreciated. Discussed the importance of dental follow-up with patient who states his understanding.  I do not think that narcotic pain  medicine for use at home would be appropriate, recommended switching from aspirin to using ibuprofen and Tylenol.  Return precautions were discussed with patient who states their understanding.  At the time of discharge patient denied any unaddressed complaints or concerns.  Patient is agreeable for discharge home.  Note: Portions of this report may have been transcribed using voice recognition software. Every effort was made to ensure accuracy; however, inadvertent computerized transcription errors may be present   Final Clinical Impression(s) / ED Diagnoses Final diagnoses:  Pain, dental  Elevated blood pressure  reading    Rx / DC Orders ED Discharge Orders     None        Norman Clay 12/10/20 2114    Lorre Nick, MD 12/12/20 1149

## 2020-12-11 ENCOUNTER — Telehealth: Payer: Self-pay | Admitting: *Deleted

## 2020-12-11 NOTE — Telephone Encounter (Signed)
Pt called regarding pharmacy not receiving Rx as stated on After Visit Summary (AVS).  RNCM called in Rx as written to [facility] [address]

## 2021-11-24 ENCOUNTER — Emergency Department (HOSPITAL_COMMUNITY)
Admission: EM | Admit: 2021-11-24 | Discharge: 2021-11-24 | Discharge status: Against Medical Advice | Payer: Self-pay | Attending: Emergency Medicine | Admitting: Emergency Medicine

## 2021-11-24 ENCOUNTER — Encounter (HOSPITAL_COMMUNITY): Payer: Self-pay

## 2021-11-24 DIAGNOSIS — R519 Headache, unspecified: Secondary | ICD-10-CM | POA: Insufficient documentation

## 2021-11-24 DIAGNOSIS — R42 Dizziness and giddiness: Secondary | ICD-10-CM | POA: Insufficient documentation

## 2021-11-24 DIAGNOSIS — Z5321 Procedure and treatment not carried out due to patient leaving prior to being seen by health care provider: Secondary | ICD-10-CM | POA: Insufficient documentation

## 2021-11-24 DIAGNOSIS — R110 Nausea: Secondary | ICD-10-CM | POA: Insufficient documentation

## 2021-11-24 LAB — CBC WITH DIFFERENTIAL/PLATELET
Abs Immature Granulocytes: 0.03 10*3/uL (ref 0.00–0.07)
Basophils Absolute: 0.1 10*3/uL (ref 0.0–0.1)
Basophils Relative: 0 %
Eosinophils Absolute: 0.1 10*3/uL (ref 0.0–0.5)
Eosinophils Relative: 1 %
HCT: 45.7 % (ref 39.0–52.0)
Hemoglobin: 15.5 g/dL (ref 13.0–17.0)
Immature Granulocytes: 0 %
Lymphocytes Relative: 19 %
Lymphs Abs: 2.5 10*3/uL (ref 0.7–4.0)
MCH: 31.1 pg (ref 26.0–34.0)
MCHC: 33.9 g/dL (ref 30.0–36.0)
MCV: 91.8 fL (ref 80.0–100.0)
Monocytes Absolute: 1.3 10*3/uL — ABNORMAL HIGH (ref 0.1–1.0)
Monocytes Relative: 10 %
Neutro Abs: 9.3 10*3/uL — ABNORMAL HIGH (ref 1.7–7.7)
Neutrophils Relative %: 70 %
Platelets: 264 10*3/uL (ref 150–400)
RBC: 4.98 MIL/uL (ref 4.22–5.81)
RDW: 13.3 % (ref 11.5–15.5)
WBC: 13.2 10*3/uL — ABNORMAL HIGH (ref 4.0–10.5)
nRBC: 0 % (ref 0.0–0.2)

## 2021-11-24 LAB — COMPREHENSIVE METABOLIC PANEL
ALT: 14 U/L (ref 0–44)
AST: 41 U/L (ref 15–41)
Albumin: 4.2 g/dL (ref 3.5–5.0)
Alkaline Phosphatase: 85 U/L (ref 38–126)
Anion gap: 8 (ref 5–15)
BUN: 15 mg/dL (ref 6–20)
CO2: 27 mmol/L (ref 22–32)
Calcium: 9.7 mg/dL (ref 8.9–10.3)
Chloride: 105 mmol/L (ref 98–111)
Creatinine, Ser: 1.21 mg/dL (ref 0.61–1.24)
GFR, Estimated: >60 mL/min (ref >60)
Glucose, Bld: 100 mg/dL — ABNORMAL HIGH (ref 70–99)
Potassium: 4.5 mmol/L (ref 3.5–5.1)
Sodium: 140 mmol/L (ref 135–145)
Total Bilirubin: 0.7 mg/dL (ref 0.3–1.2)
Total Protein: 8.2 g/dL — ABNORMAL HIGH (ref 6.5–8.1)

## 2021-11-24 LAB — CBG MONITORING, ED: Glucose-Capillary: 165 mg/dL — ABNORMAL HIGH (ref 70–99)

## 2021-11-24 NOTE — ED Triage Notes (Signed)
Pt arrived via POV, c/o dizziness, headache. States he smoked some marijuana yesterday and believes it was laced.

## 2021-11-24 NOTE — ED Provider Triage Note (Signed)
Emergency Medicine Provider Triage Evaluation Note  Charles Garza , a 61 y.o. male  was evaluated in triage.  Pt complains of dizziness.  Patient he is a regular marijuana smoker.  He says yesterday he was hanging out with some friends he has not seen in a while and they were smoking a blunt.  He says he took about 5 big hits and suddenly started feeling really off.  He says ever since then he has been dizzy and has had a constant headache. He has associated nausea.  He says this is not normal for him and he is concerned that it was laced with something else. Denies any chest pain, shortness of breath, vomiting  Review of Systems  Positive:  Negative:   Physical Exam  BP (!) 152/86 (BP Location: Left Arm)   Pulse 96   Temp 98.2 F (36.8 C) (Oral)   Resp 18   SpO2 100%  Gen:   Awake, no distress   Resp:  Normal effort  MSK:   Moves extremities without difficulty  Other:    Medical Decision Making  Medically screening exam initiated at 1:02 PM.  Appropriate orders placed.  Charles Garza was informed that the remainder of the evaluation will be completed by another provider, this initial triage assessment does not replace that evaluation, and the importance of remaining in the ED until their evaluation is complete.     Charles Garza, [place] 11/24/21 1303

## 2022-01-26 ENCOUNTER — Other Ambulatory Visit: Payer: Self-pay

## 2022-01-26 ENCOUNTER — Emergency Department (HOSPITAL_COMMUNITY)
Admission: EM | Admit: 2022-01-26 | Discharge: 2022-01-26 | Disposition: A | Discharge status: Home / Self Care | Payer: 59 | Attending: Emergency Medicine | Admitting: Emergency Medicine

## 2022-01-26 ENCOUNTER — Encounter (HOSPITAL_COMMUNITY): Payer: Self-pay

## 2022-01-26 DIAGNOSIS — M79605 Pain in left leg: Secondary | ICD-10-CM | POA: Diagnosis not present

## 2022-01-26 DIAGNOSIS — M5432 Sciatica, left side: Secondary | ICD-10-CM

## 2022-01-26 DIAGNOSIS — M5442 Lumbago with sciatica, left side: Secondary | ICD-10-CM | POA: Insufficient documentation

## 2022-01-26 DIAGNOSIS — G8929 Other chronic pain: Secondary | ICD-10-CM | POA: Diagnosis not present

## 2022-01-26 DIAGNOSIS — Z743 Need for continuous supervision: Secondary | ICD-10-CM | POA: Diagnosis not present

## 2022-01-26 DIAGNOSIS — M5431 Sciatica, right side: Secondary | ICD-10-CM | POA: Diagnosis not present

## 2022-01-26 DIAGNOSIS — M5441 Lumbago with sciatica, right side: Secondary | ICD-10-CM | POA: Diagnosis not present

## 2022-01-26 HISTORY — DX: Sciatica, unspecified side: M54.30

## 2022-01-26 MED ORDER — LIDOCAINE 5 % EX PTCH
1.0000 | MEDICATED_PATCH | CUTANEOUS | Status: DC
  Administered 2022-01-26: 1 via TRANSDERMAL
  Filled 2022-01-26: qty 1

## 2022-01-26 MED ORDER — GABAPENTIN 100 MG PO CAPS: 100.0000 mg | ORAL_CAPSULE | Freq: Three times a day (TID) | ORAL | 0 refills | Status: AC

## 2022-01-26 MED ORDER — TIZANIDINE HCL 4 MG PO TABS: 4.0000 mg | ORAL_TABLET | Freq: Three times a day (TID) | ORAL | 0 refills | Status: AC | PRN

## 2022-01-26 NOTE — Discharge Instructions (Addendum)
Please take tylenol/ibuprofen for pain. Take gabapentin as prescribed. Take tizanidine as needed. I recommend close follow-up with PCP for reevaluation.  Please do not hesitate to return to emergency department if worrisome signs symptoms we discussed become apparent.

## 2022-01-26 NOTE — ED Provider Notes (Signed)
[facility] DEPT Provider Note   CSN: [id number] Arrival date & time: 01/26/22  0610     History  Chief Complaint  Patient presents with   Leg Pain    Charles Garza is a 61 y.o. male history of sciatica presented to the emergency room for evaluation of leg pain.  Patient has ongoing sciatica pain for the last 2 years.  States his sciatic pain has gotten worse on the left side.  The pain starts from the lower back and radiates to his bilateral lower extremities.  Patient has difficulty with walking due to pain.  States he has fallen twice in the last week.  Denies having any injuries.  States the pain is similar to the sciatic pain he had in the past which improved with gabapentin.  Denies any upper extremity weakness.  States he feels numbness on the left flank down to his left knee however is not new.  Leg Pain Associated symptoms: back pain        Home Medications Prior to Admission medications   Medication Sig Start Date End Date Taking? Authorizing Provider  cetirizine (ZYRTEC ALLERGY) 10 MG tablet Take 1 tablet (10 mg total) by mouth daily. 07/08/20   Petrucelli, Samantha R, PA-C  clindamycin (CLEOCIN) 150 MG capsule Take 2 capsules (300 mg total) by mouth 3 (three) times daily. May dispense as 150mg  capsules 12/10/20   Larene Pickett, PA-C  HYDROcodone-acetaminophen (NORCO/VICODIN) 5-325 MG tablet Take 1-2 tablets by mouth every 4 (four) hours as needed for moderate pain. 10/24/16   Mariel Aloe, MD  lidocaine (LIDODERM) 5 % Place 1 patch onto the skin daily as needed. Apply patch to area most significant pain once per day.  Remove and discard patch within 12 hours of application. 10/16/20   Petrucelli, Samantha R, PA-C  methocarbamol (ROBAXIN) 500 MG tablet Take 1 tablet (500 mg total) by mouth every 8 (eight) hours as needed for muscle spasms. 10/16/20   Petrucelli, Aldona Bar R, PA-C  naproxen (NAPROSYN) 500 MG tablet Take 1 tablet (500 mg total) by  mouth 2 (two) times daily with a meal. 12/10/20   Baird Cancer, Vilinda Blanks, PA-C  predniSONE (DELTASONE) 10 MG tablet Days 1-4 take 4 tablets (40 mg) daily  Days 5-8 take 3 tablets (30 mg) daily, Days 9-11 take 2 tablets (20 mg) daily, Days 12-14 take 1 tablet (10 mg) daily 10/20/20   Muthersbaugh, Jarrett Soho, PA-C  senna-docusate (SENOKOT-S) 8.6-50 MG tablet Take 1 tablet by mouth at bedtime. Take while using narcotic pain medication (Norco/Vicodin) 10/24/16   Mariel Aloe, MD      Allergies    Penicillins    Review of Systems   Review of Systems  Musculoskeletal:  Positive for back pain.       Leg pain    Physical Exam Updated Vital Signs BP (!) 162/88   Pulse 82   Temp 98 F (36.7 C) (Oral)   Resp 17   Ht 5\' 6"  (1.676 m)   Wt 83.9 kg   SpO2 91%   BMI 29.86 kg/m  Physical Exam Vitals and nursing note reviewed.  Constitutional:      Appearance: Normal appearance.  HENT:     Head: Normocephalic and atraumatic.     Mouth/Throat:     Mouth: Mucous membranes are moist.  Eyes:     General: No scleral icterus. Cardiovascular:     Rate and Rhythm: Normal rate and regular rhythm.     Pulses: Normal  pulses.     Heart sounds: Normal heart sounds.  Pulmonary:     Effort: Pulmonary effort is normal.     Breath sounds: Normal breath sounds.  Abdominal:     General: Abdomen is flat.     Palpations: Abdomen is soft.     Tenderness: There is no abdominal tenderness.  Musculoskeletal:        General: No deformity.     Comments: Pain with movement of L lower extremities.  Skin:    General: Skin is warm.     Findings: No rash.  Neurological:     General: No focal deficit present.     Mental Status: He is alert.  Psychiatric:        Mood and Affect: Mood normal.     ED Results / Procedures / Treatments   Labs (all labs ordered are listed, but only abnormal results are displayed) Labs Reviewed - No data to display  EKG None  Radiology No results found.  Procedures Procedures     Medications Ordered in ED Medications - No data to display  ED Course/ Medical Decision Making/ A&P                           Medical Decision Making Risk Prescription drug management.   This patient presents to the ED for concern of low back pain, bilateral leg pain, this involves an extensive number of treatment options, and is a complaint that carries with a high risk of complications and morbidity.  The differential diagnosis includes sciatica, spinal stenosis, musculoskeletal pain.  This is not an exhaustive list.  Comorbidities that complicate the patient evaluation See HPI  Social determinants of health NA  Additional history obtained: Additional history obtained from EMR. External records from outside source obtained and review including prior labs  Cardiac monitoring/EKG: The patient was maintained on a cardiac monitor.  I personally reviewed and interpreted the cardiac monitor which showed an underlying rhythm of: Sinus rhythm.  Lab tests: None  Imaging studies: None  Problem list/ ED course/ Critical interventions/ Medical management: HPI: See above Vital signs BP 162/88.  Otherwise within normal range and stable throughout visit. Laboratory/imaging studies significant for: See above. On physical examination, patient is afebrile and appears in no acute distress.  He does have tenderness to palpation to the lower back and left lower extremity.  Unable to lift left leg against resistance due to pain.  Patient's clinical presentations and laboratory/imaging studies are most concerned for sciatic pain.  Low suspicions for CVA as symptoms are similar to his sciatica pain in the past when he was treated with gabapentin.  I sent an Rx of gabapentin and tizanidine for treatment of sciatica which patient has used in the past with significant improvement. Lidocaine patch, amantadine, gabapentin ordered. Reevaluation of the patient after these medications showed that the  patient stayed the same.   I have reviewed the patient home medication medicines and have him make adjustments as needed.  Consultations obtained: NA  Disposition Continued outpatient therapy. Follow-up with PCP recommended for reevaluation of symptoms. Treatment plan discussed with patient.  Pt acknowledged understanding was agreeable to the plan. Worrisome signs and symptoms were discussed with patient, and patient acknowledged understanding to return to the ED if they noticed these signs and symptoms. Patient was stable upon discharge.   This chart was dictated using voice recognition software.  Despite best efforts to proofread,  errors can occur which  can change the documentation meaning.          Final Clinical Impression(s) / ED Diagnoses Final diagnoses:  Bilateral sciatica    Rx / DC Orders ED Discharge Orders          Ordered    gabapentin (NEURONTIN) 100 MG capsule  3 times daily        01/26/22 1145    tiZANidine (ZANAFLEX) 4 MG tablet  Every 8 hours PRN        01/26/22 1145              Jeanelle Malling, [place] 01/26/22 1957    Tegeler, Canary Brim, MD 01/27/22 [phone]

## 2022-01-26 NOTE — ED Triage Notes (Signed)
Pt BIB [facility] with reports of left leg pain that goes numb after the knee. The pain originally starts in the back. Hx of sciatica.

## 2022-02-07 ENCOUNTER — Encounter (HOSPITAL_COMMUNITY): Payer: Self-pay | Admitting: Emergency Medicine

## 2022-02-07 ENCOUNTER — Other Ambulatory Visit: Payer: Self-pay

## 2022-02-07 ENCOUNTER — Ambulatory Visit (HOSPITAL_COMMUNITY)
Admission: EM | Admit: 2022-02-07 | Discharge: 2022-02-07 | Disposition: A | Discharge status: Home / Self Care | Payer: 59 | Attending: Family Medicine | Admitting: Family Medicine

## 2022-02-07 DIAGNOSIS — M25561 Pain in right knee: Secondary | ICD-10-CM

## 2022-02-07 DIAGNOSIS — G8929 Other chronic pain: Secondary | ICD-10-CM

## 2022-02-07 DIAGNOSIS — M25562 Pain in left knee: Secondary | ICD-10-CM | POA: Diagnosis not present

## 2022-02-07 MED ORDER — MELOXICAM 15 MG PO TABS: 15.0000 mg | ORAL_TABLET | Freq: Every day | ORAL | 0 refills | Status: DC

## 2022-02-07 NOTE — ED Triage Notes (Signed)
Patient is requesting fluid to be removed from left knee.  Patient reports he has a history of having fluid removed from knees many years.

## 2022-02-07 NOTE — ED Triage Notes (Signed)
Patient reports he is being treated for sciatic nerve pain

## 2022-02-07 NOTE — Discharge Instructions (Signed)
Meloxicam 15 mg--1 tablet daily for knee and joint pain.  You can use the QR code/website at the back of the summary paperwork to schedule yourself a new patient appointment with primary care

## 2022-02-07 NOTE — ED Provider Notes (Signed)
[facility]    CSN: [id number] Arrival date & time: 02/07/22  1118      History   Chief Complaint Chief Complaint  Patient presents with   Back Pain    HPI Charles Garza is a 61 y.o. male.    Back Pain  Here for bilateral knee pain.  The left knee especially is bothering him but the right 1 is also.  He feels that there may be is some fluid to be drawn off.  No recent trauma.  He had been on gabapentin and naproxen for his sciatica, but he is out of the naproxen now.  He states in retrospect it did not really help that much anyway.  Past Medical History:  Diagnosis Date   Sciatica     Patient Active Problem List   Diagnosis Date Noted   Abscess of right hand 10/24/2016   Cellulitis of hand 10/23/2016   Renal insufficiency, mild 10/23/2016   Cellulitis of right hand     Past Surgical History:  Procedure Laterality Date   INCISION AND DRAINAGE ABSCESS Right 10/23/2016   Procedure: INCISION AND DRAINAGE, RIGHT HAND;  Surgeon: Dairl Ponder, MD;  Location: [facility] OR;  Service: Orthopedics;  Laterality: Right;       Home Medications    Prior to Admission medications   Medication Sig Start Date End Date Taking? Authorizing Provider  meloxicam (MOBIC) 15 MG tablet Take 1 tablet (15 mg total) by mouth daily. 02/07/22  Yes Zenia Resides, MD  tizanidine (ZANAFLEX) 2 MG capsule Take 2 mg by mouth 3 (three) times daily.   Yes [provider]  cetirizine (ZYRTEC ALLERGY) 10 MG tablet Take 1 tablet (10 mg total) by mouth daily. 07/08/20   Petrucelli, Samantha R, PA-C  gabapentin (NEURONTIN) 100 MG capsule Take 1 capsule (100 mg total) by mouth 3 (three) times daily. 01/26/22 02/25/22  Jeanelle Malling, PA  lidocaine (LIDODERM) 5 % Place 1 patch onto the skin daily as needed. Apply patch to area most significant pain once per day.  Remove and discard patch within 12 hours of application. 10/16/20   Petrucelli, Samantha R, PA-C  senna-docusate (SENOKOT-S) 8.6-50  MG tablet Take 1 tablet by mouth at bedtime. Take while using narcotic pain medication (Norco/Vicodin) 10/24/16   Narda Bonds, MD    Family History Family History  Problem Relation Age of Onset   CAD Father    Lung cancer Sister     Social History Social History   Tobacco Use   Smoking status: Every Day    Types: Cigarettes   Smokeless tobacco: Current  Vaping Use   Vaping Use: Never used  Substance Use Topics   Alcohol use: No   Drug use: No     Allergies   Penicillins   Review of Systems Review of Systems  Musculoskeletal:  Positive for back pain.     Physical Exam Triage Vital Signs ED Triage Vitals  Enc Vitals Group     BP 02/07/22 1311 (!) 149/97     Pulse Rate 02/07/22 1311 85     Resp 02/07/22 1311 18     Temp 02/07/22 1311 97.9 F (36.6 C)     Temp Source 02/07/22 1311 Oral     SpO2 02/07/22 1311 97 %     Weight --      Height --      Head Circumference --      Peak Flow --      Pain Score  02/07/22 1307 10     Pain Loc --      Pain Edu? --      Excl. in GC? --    No data found.  Updated Vital Signs BP (!) 149/97 (BP Location: Right Arm)   Pulse 85   Temp 97.9 F (36.6 C) (Oral)   Resp 18   SpO2 97%   Visual Acuity Right Eye Distance:   Left Eye Distance:   Bilateral Distance:    Right Eye Near:   Left Eye Near:    Bilateral Near:     Physical Exam Vitals reviewed.  Constitutional:      General: He is not in acute distress.    Appearance: He is not ill-appearing, toxic-appearing or diaphoretic.  HENT:     Mouth/Throat:     Mouth: Mucous membranes are moist.  Musculoskeletal:     Comments: Is no obvious effusion on either knee.  There is some joint tenderness medially on both sides.  No erythema and no deformity.  Skin:    Coloration: Skin is not pale.     Findings: No rash.  Neurological:     Mental Status: He is alert and oriented to person, place, and time.  Psychiatric:        Behavior: Behavior normal.       UC Treatments / Results  Labs (all labs ordered are listed, but only abnormal results are displayed) Labs Reviewed - No data to display  EKG   Radiology No results found.  Procedures Procedures (including critical care time)  Medications Ordered in UC Medications - No data to display  Initial Impression / Assessment and Plan / UC Course  I have reviewed the triage vital signs and the nursing notes.  Pertinent labs & imaging results that were available during my care of the patient were reviewed by me and considered in my medical decision making (see chart for details).        Meloxicam is sent instead of the naproxen.  He is given contact information for orthopedics. Final Clinical Impressions(s) / UC Diagnoses   Final diagnoses:  Chronic pain of both knees     Discharge Instructions      Meloxicam 15 mg--1 tablet daily for knee and joint pain.  You can use the QR code/website at the back of the summary paperwork to schedule yourself a new patient appointment with primary care      ED Prescriptions     Medication Sig Dispense Auth. Provider   meloxicam (MOBIC) 15 MG tablet Take 1 tablet (15 mg total) by mouth daily. 30 tablet Aya Geisel, Janace Aris, MD      I have reviewed the PDMP during this encounter.   Zenia Resides, MD 02/07/22 1340

## 2022-04-09 IMAGING — CT CT ABD-PELV W/ CM
2 of 5 series · 16 of 46 positions shown, 18 images · IV contrast (omnipaque)
Comparison: None.

CLINICAL DATA: Diffuse abdominal pain with nausea and vomiting

EXAM:
CT ABDOMEN AND PELVIS WITH CONTRAST
TECHNIQUE: Multidetector CT imaging of the abdomen and pelvis was performed
using the standard protocol following bolus administration of
intravenous contrast.
CONTRAST:  100mL OMNIPAQUE IOHEXOL 300 MG/ML  SOLN

[Series 3: abd/ pelvis 5.0 i30f 2 · axial · 0.76mm/px · z∈[+778,+1148]mm · 13 of 84 slices shown, 15 images]
[im 5/84  soft-tissue]
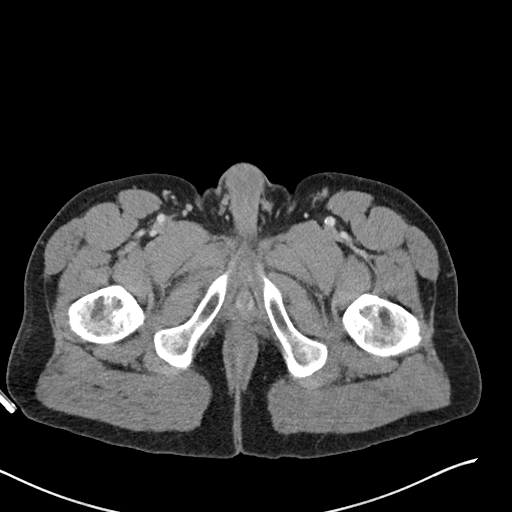
[im 5/84  bone]
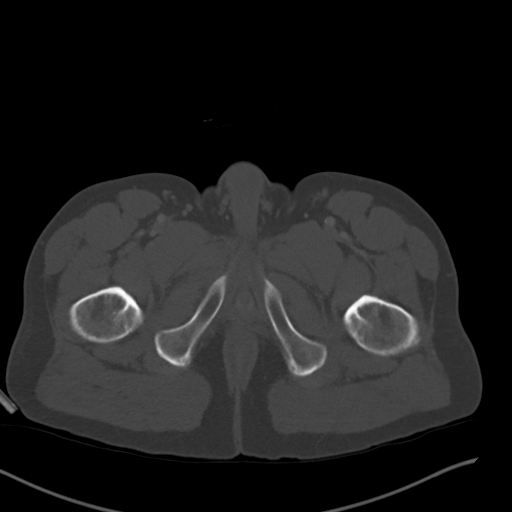
[im 14/84  soft-tissue]
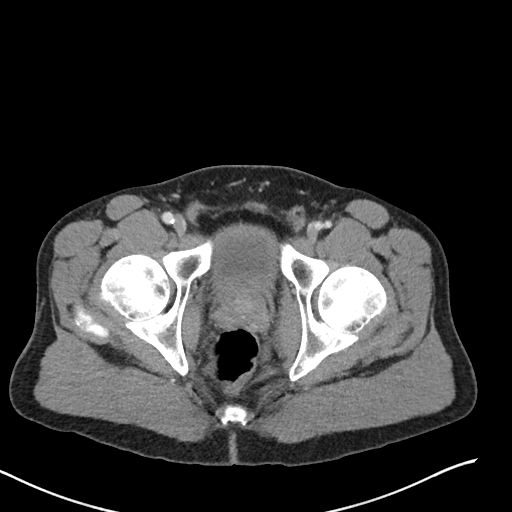
[im 18/84  soft-tissue]
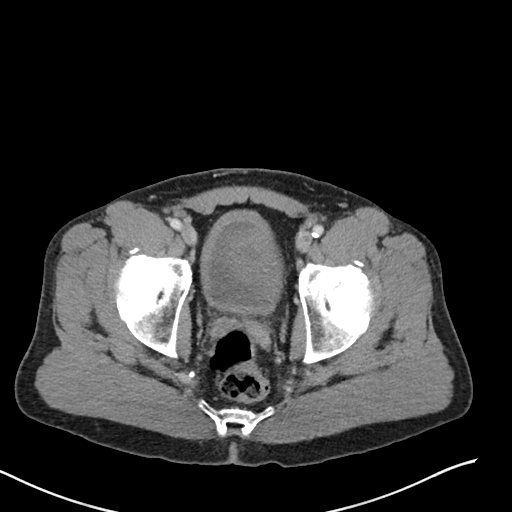
[im 22/84  soft-tissue]
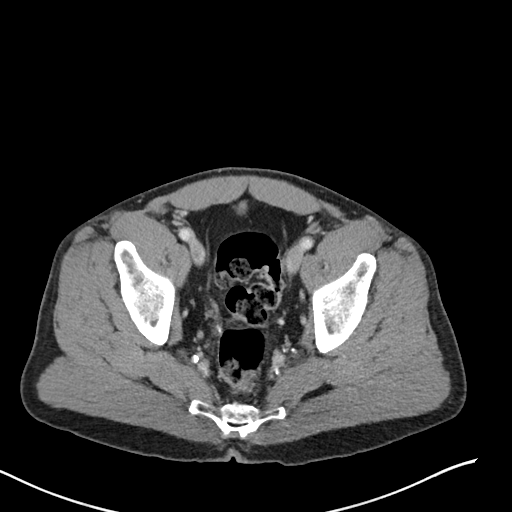
[im 31/84  soft-tissue]
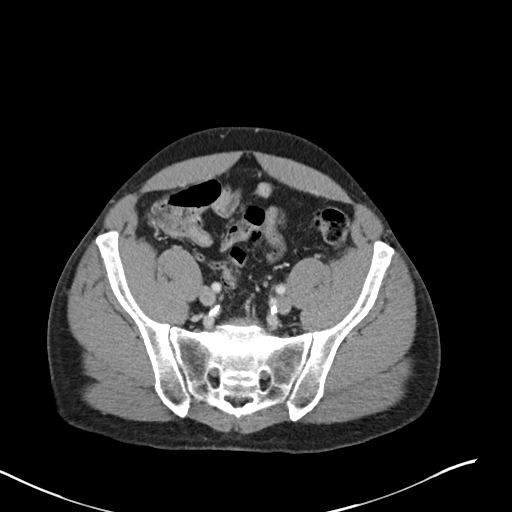
[im 35/84  soft-tissue]
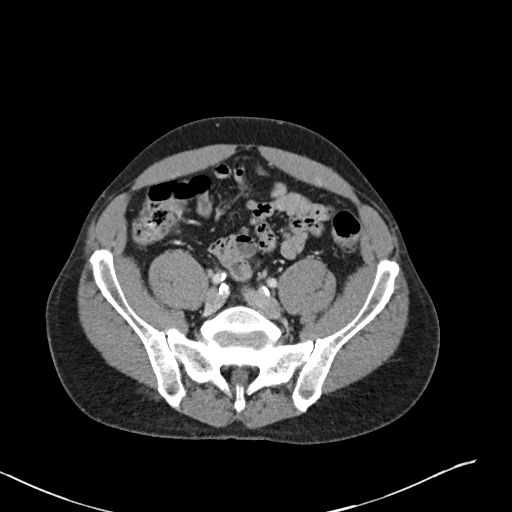
[im 44/84  soft-tissue]
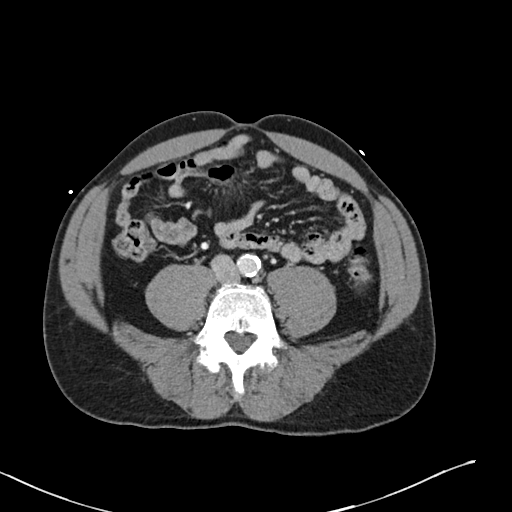
[im 49/84  soft-tissue]
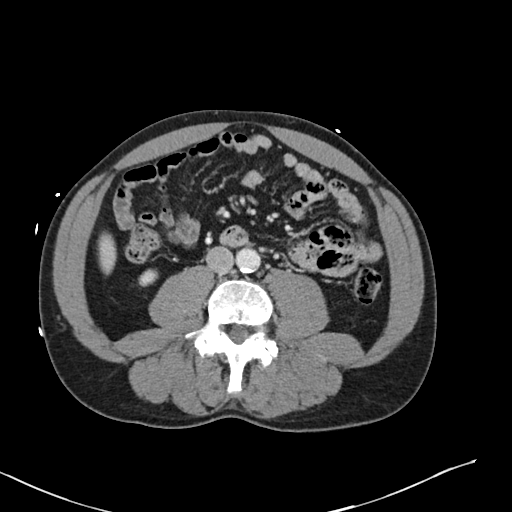
[im 53/84  soft-tissue]
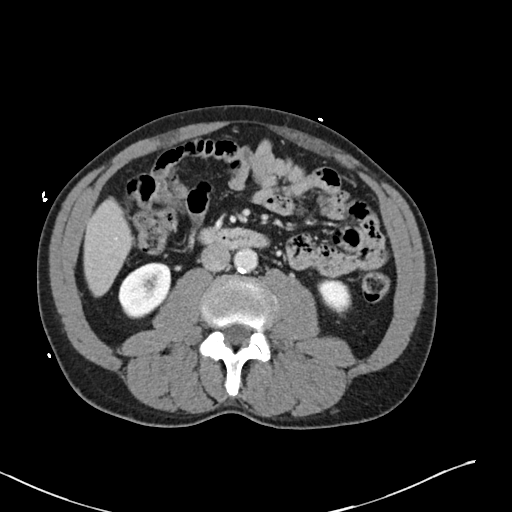
[im 53/84  bone]
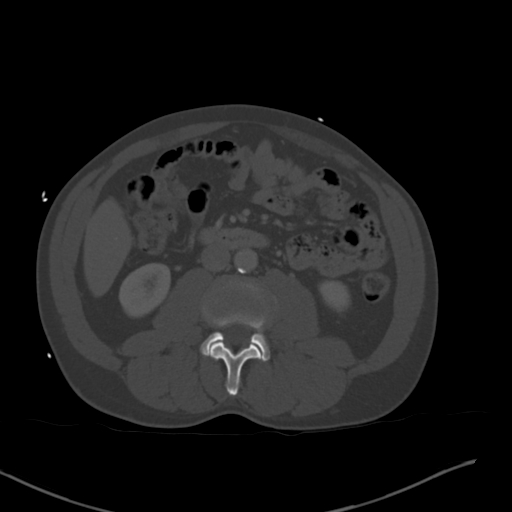
[im 62/84  soft-tissue]
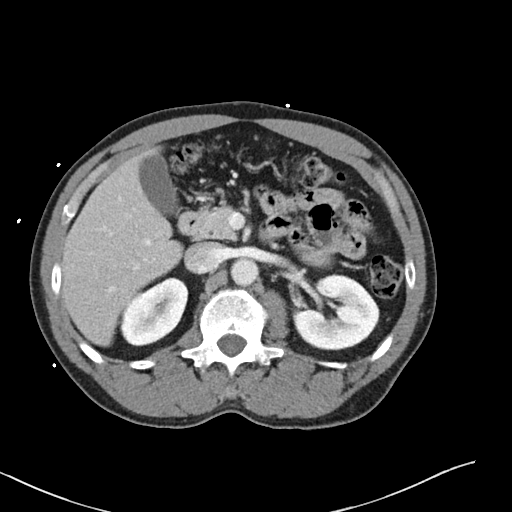
[im 66/84  soft-tissue]
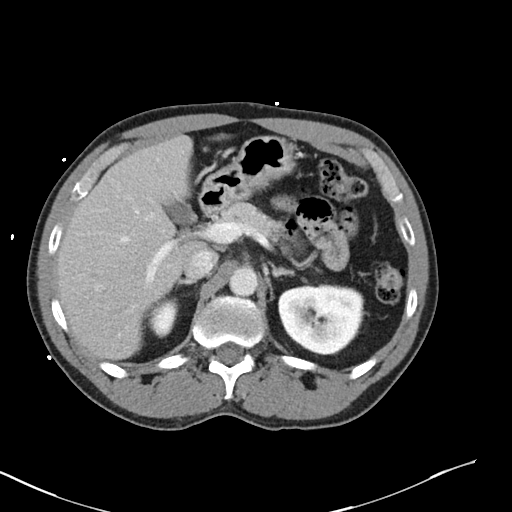
[im 70/84  soft-tissue]
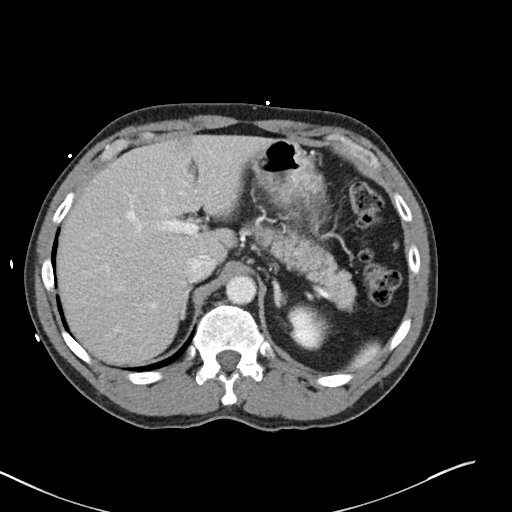
[im 79/84  soft-tissue]
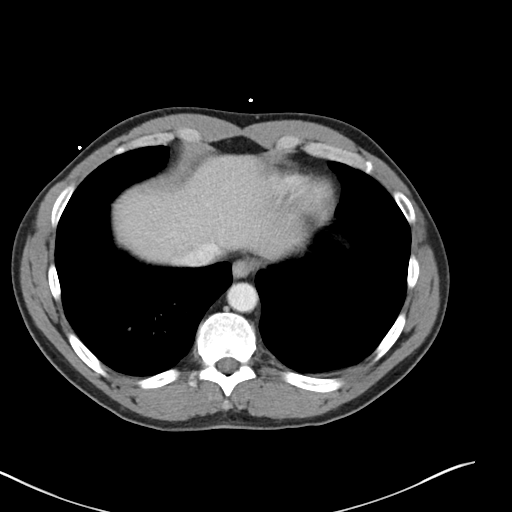

[Series 6: coronal soft tissue · coronal · 0.72mm/px · 3 of 85 slices shown]
[im 29/85  soft-tissue]
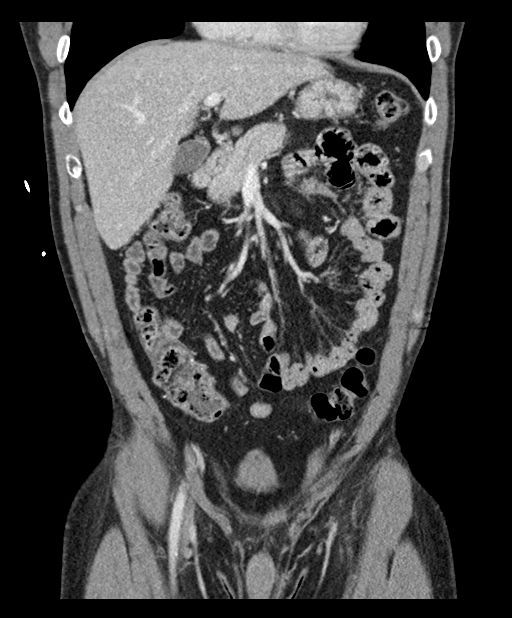
[im 38/85  soft-tissue]
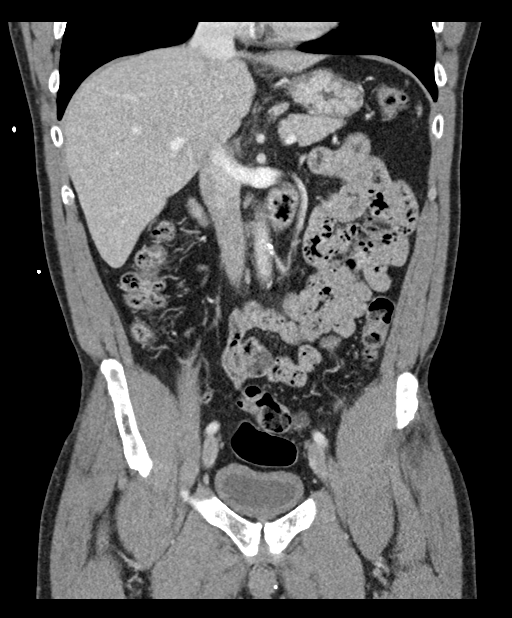
[im 47/85  soft-tissue]
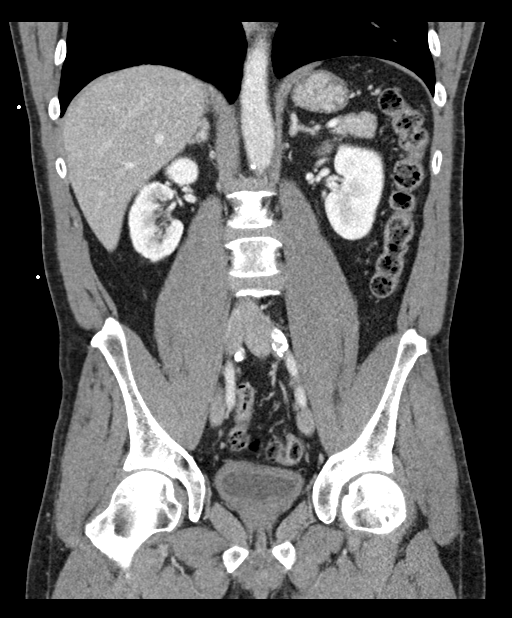

[16 of 46 positions shown; findings below may reference images not displayed]

FINDINGS: Lower chest: No acute abnormality.

Hepatobiliary: Probable area of focal fatty infiltration along the
falciform ligament. Otherwise, no focal liver abnormality.
Gallbladder is unremarkable. No hyperdense gallstone. No biliary
dilatation.

Pancreas: Unremarkable. No pancreatic ductal dilatation or
surrounding inflammatory changes.

Spleen: Normal in size without focal abnormality.

Adrenals/Urinary Tract: Unremarkable adrenal glands. Kidneys enhance
symmetrically. No renal lesion, stone, or hydronephrosis.
Circumferential thickening of the urinary bladder.

Stomach/Bowel: Stomach is within normal limits. Appendix appears
normal (series 3, image 53). No evidence of bowel wall thickening,
distention, or inflammatory changes.

Vascular/Lymphatic: Prominent atherosclerotic calcifications
throughout the aortoiliac axis. No aneurysm. No abdominopelvic
lymphadenopathy.

Reproductive: Prostate is unremarkable.

Other: No free fluid. No abdominopelvic fluid collection. No
pneumoperitoneum. No abdominal wall hernia.

Musculoskeletal: Facet predominant lower lumbar spondylosis. Rounded
8 mm calcification along the left side of the canal at the L3-4
level, which could represent a calcified facet synovial cyst or
possibly sequestered disc fragment (series 3, image 38). This likely
contributes to a degree of canal stenosis at this level.
IMPRESSION: 1. Circumferential thickening of the urinary bladder wall, which may
represent cystitis. Correlation with urinalysis is recommended.
2. Rounded 8 mm calcification along the left side of the canal at
the L3-4 level, which could represent a calcified facet synovial
cyst or possibly sequestered disc fragment. This likely contributes
to a degree of canal stenosis at this level. This could be further
evaluated with nonemergent MRI as clinically indicated based on
patient's symptoms.

Aortic Atherosclerosis ([id number]).

## 2022-04-30 ENCOUNTER — Ambulatory Visit (HOSPITAL_COMMUNITY)
Admission: EM | Admit: 2022-04-30 | Discharge: 2022-04-30 | Disposition: A | Discharge status: Home / Self Care | Payer: 59 | Attending: Internal Medicine | Admitting: Internal Medicine

## 2022-04-30 ENCOUNTER — Encounter (HOSPITAL_COMMUNITY): Payer: Self-pay | Admitting: Emergency Medicine

## 2022-04-30 DIAGNOSIS — M5441 Lumbago with sciatica, right side: Secondary | ICD-10-CM | POA: Diagnosis not present

## 2022-04-30 MED ORDER — METHOCARBAMOL 500 MG PO TABS: 500.0000 mg | ORAL_TABLET | Freq: Two times a day (BID) | ORAL | 0 refills | Status: DC

## 2022-04-30 MED ORDER — PREDNISONE 20 MG PO TABS: 40.0000 mg | ORAL_TABLET | Freq: Every day | ORAL | 0 refills | Status: AC

## 2022-04-30 NOTE — ED Triage Notes (Signed)
Hx of sciatica. Pt says his lower back pain flare up started Sunday. States it starts in his back and radiates down both legs. Taking advil at home that provides mild relief.

## 2022-04-30 NOTE — Discharge Instructions (Signed)
Your pain is due to sciatic nerve pain.  Take 40 mg of prednisone once daily for the next 5 days to treat sciatic nerve pain.  Take prednisone with food to avoid stomach upset.  Do not take any other type of anti-inflammatory medication when taking prednisone such as Aleve, naproxen, or ibuprofen/Motrin.  You may take Robaxin muscle relaxer mainly at bedtime to help with muscle spasm to the area.  Perform range of motion exercises and apply heat to the area to reduce pain as well.  Schedule an appointment with [facility] as needed for ongoing evaluation and management of pain.  If you develop any new or worsening symptoms or do not improve in the next 2 to 3 days, please return.  If your symptoms are severe, please go to the emergency room.  Follow-up with your primary care provider for further evaluation and management of your symptoms as well as ongoing wellness visits.  I hope you feel better!

## 2022-04-30 NOTE — ED Provider Notes (Addendum)
[place]    CSN: [id number] Arrival date & time: 04/30/22  1001      History   Chief Complaint Chief Complaint  Patient presents with   Back Pain    HPI Charles Garza is a 62 y.o. male.   Patient presents urgent care for evaluation of low back pain that started 1 week ago to the lumbar spine with radicular pain to the right lower extremity.  No recent injuries or trauma to the low back reported.  Patient states he has not been lifting any heavy objects or straining his back recently.  He does have a history of low back pain with sciatica of both sides.  He was last treated for bilateral sciatic nerve pain 2 months ago with a steroid and states this helped significantly.  Denies saddle anesthesia symptoms, urinary symptoms, constipation, diarrhea, numbness/tingling to the bilateral lower extremities, recent falls, midline low back pain, and difficulty ambulating.  Denies constipation and blood/mucus in the stools.  No abdominal pain reported.  Pain is currently a 10 on a scale of 0-10.  No history of spinal surgical procedures.  He points to the right lumbar paraspinal region as focal area of pain and states the pain used to be to the left side but has moved to the right.  Denies recent steroid/antibiotic use.  Movement makes pain worse.  He has been using Advil over-the-counter without relief of discomfort.   Back Pain   Past Medical History:  Diagnosis Date   Sciatica     Patient Active Problem List   Diagnosis Date Noted   Abscess of right hand 10/24/2016   Cellulitis of hand 10/23/2016   Renal insufficiency, mild 10/23/2016   Cellulitis of right hand     Past Surgical History:  Procedure Laterality Date   INCISION AND DRAINAGE ABSCESS Right 10/23/2016   Procedure: INCISION AND DRAINAGE, RIGHT HAND;  Surgeon: Charlotte Crumb, MD;  Location: [facility];  Service: Orthopedics;  Laterality: Right;       Home Medications    Prior to Admission medications    Medication Sig Start Date End Date Taking? Authorizing Provider  methocarbamol (ROBAXIN) 500 MG tablet Take 1 tablet (500 mg total) by mouth 2 (two) times daily. 04/30/22  Yes Talbot Grumbling, FNP  predniSONE (DELTASONE) 20 MG tablet Take 2 tablets (40 mg total) by mouth daily for 5 days. 04/30/22 05/05/22 Yes StanhopeStasia Cavalier, FNP  cetirizine (ZYRTEC ALLERGY) 10 MG tablet Take 1 tablet (10 mg total) by mouth daily. 07/08/20   Petrucelli, Samantha R, PA-C  gabapentin (NEURONTIN) 100 MG capsule Take 1 capsule (100 mg total) by mouth 3 (three) times daily. 01/26/22 02/25/22  Rex Kras, PA  lidocaine (LIDODERM) 5 % Place 1 patch onto the skin daily as needed. Apply patch to area most significant pain once per day.  Remove and discard patch within 12 hours of application. 10/16/20   Petrucelli, Glynda Jaeger, PA-C  meloxicam (MOBIC) 15 MG tablet Take 1 tablet (15 mg total) by mouth daily. 02/07/22   Barrett Henle, MD  senna-docusate (SENOKOT-S) 8.6-50 MG tablet Take 1 tablet by mouth at bedtime. Take while using narcotic pain medication (Norco/Vicodin) 10/24/16   Mariel Aloe, MD    Family History Family History  Problem Relation Age of Onset   CAD Father    Lung cancer Sister     Social History Social History   Tobacco Use   Smoking status: Every Day    Types: Cigarettes  Smokeless tobacco: Current  Vaping Use   Vaping Use: Never used  Substance Use Topics   Alcohol use: No   Drug use: No     Allergies   Penicillins   Review of Systems Review of Systems  Musculoskeletal:  Positive for back pain.  Per HPI   Physical Exam Triage Vital Signs ED Triage Vitals [04/30/22 1012]  Enc Vitals Group     BP (!) 160/96     Pulse Rate 82     Resp 16     Temp 98 F (36.7 C)     Temp Source Oral     SpO2 98 %     Weight      Height      Head Circumference      Peak Flow      Pain Score 7     Pain Loc      Pain Edu?      Excl. in GC?    No data found.  Updated Vital  Signs BP (!) 160/96 (BP Location: Left Arm)   Pulse 82   Temp 98 F (36.7 C) (Oral)   Resp 16   SpO2 98%   Visual Acuity Right Eye Distance:   Left Eye Distance:   Bilateral Distance:    Right Eye Near:   Left Eye Near:    Bilateral Near:     Physical Exam Vitals and nursing note reviewed.  Constitutional:      Appearance: He is not ill-appearing or toxic-appearing.  HENT:     Head: Normocephalic and atraumatic.     Right Ear: Hearing and external ear normal.     Left Ear: Hearing and external ear normal.     Nose: Nose normal.     Mouth/Throat:     Lips: Pink.  Eyes:     General: Lids are normal. Vision grossly intact. Gaze aligned appropriately.     Extraocular Movements: Extraocular movements intact.     Conjunctiva/sclera: Conjunctivae normal.  Cardiovascular:     Rate and Rhythm: Normal rate and regular rhythm.     Heart sounds: Normal heart sounds, S1 normal and S2 normal.  Pulmonary:     Effort: Pulmonary effort is normal. No respiratory distress.     Breath sounds: Normal breath sounds and air entry.  Abdominal:     General: Abdomen is flat. Bowel sounds are normal.     Palpations: Abdomen is soft.     Tenderness: There is no right CVA tenderness or left CVA tenderness.  Musculoskeletal:     Cervical back: Normal and neck supple.     Thoracic back: Normal.     Lumbar back: Tenderness present. No swelling, edema, deformity, signs of trauma, lacerations, spasms or bony tenderness. Decreased range of motion. Negative right straight leg raise test and negative left straight leg raise test.     Comments: No midline tenderness to the low back.  TTP to the right-sided sciatic notch with radicular pain elicited to the right lower extremity.  TTP to the right sided lumbar paraspinals.  Nontender to bilateral CVAs.  Neurovascularly intact to bilateral lower extremities with normal strength and sensation.  Straight leg raises bilaterally negative.  Skin:    General: Skin  is warm and dry.     Capillary Refill: Capillary refill takes less than 2 seconds.     Findings: No rash.  Neurological:     General: No focal deficit present.     Mental Status: He is alert  and oriented to person, place, and time. Mental status is at baseline.     Cranial Nerves: No dysarthria or facial asymmetry.  Psychiatric:        Mood and Affect: Mood normal.        Speech: Speech normal.        Behavior: Behavior normal.        Thought Content: Thought content normal.        Judgment: Judgment normal.      UC Treatments / Results  Labs (all labs ordered are listed, but only abnormal results are displayed) Labs Reviewed - No data to display  EKG   Radiology No results found.  Procedures Procedures (including critical care time)  Medications Ordered in UC Medications - No data to display  Initial Impression / Assessment and Plan / UC Course  I have reviewed the triage vital signs and the nursing notes.  Pertinent labs & imaging results that were available during my care of the patient were reviewed by me and considered in my medical decision making (see chart for details).   1.  Acute right-sided low back pain with right-sided sciatica Presentation is consistent with acute sciatic nerve pain.  Patient has failed use of NSAIDs for the last 1 week, therefore I would like to go ahead and treat with 40 mg prednisone burst for 5 days.  Robaxin sent to the pharmacy to be used as needed mostly at nighttime due to drowsiness side effect for muscular involvement.  Heat and gentle range of motion exercises recommended.  Patient does not describe any red flag signs or symptoms indicating need for imaging or referral to the emergency department.  Deferred imaging based on stable musculoskeletal exam findings in clinic and atraumatic mechanism of injury/pain.  Patient is agreeable with plan.  Given walking referral to orthopedics should he experience failure of improvement in symptoms  in the next 1 to 2 weeks using above interventions.   Discussed physical exam and available lab work findings in clinic with patient.  Counseled patient regarding appropriate use of medications and potential side effects for all medications recommended or prescribed today. Discussed red flag signs and symptoms of worsening condition,when to call the PCP office, return to urgent care, and when to seek higher level of care in the emergency department. Patient verbalizes understanding and agreement with plan. All questions answered. Patient discharged in stable condition.    Final Clinical Impressions(s) / UC Diagnoses   Final diagnoses:  Acute right-sided low back pain with right-sided sciatica     Discharge Instructions      Your pain is due to sciatic nerve pain.  Take 40 mg of prednisone once daily for the next 5 days to treat sciatic nerve pain.  Take prednisone with food to avoid stomach upset.  Do not take any other type of anti-inflammatory medication when taking prednisone such as Aleve, naproxen, or ibuprofen/Motrin.  You may take Robaxin muscle relaxer mainly at bedtime to help with muscle spasm to the area.  Perform range of motion exercises and apply heat to the area to reduce pain as well.  Schedule an appointment with [facility] as needed for ongoing evaluation and management of pain.  If you develop any new or worsening symptoms or do not improve in the next 2 to 3 days, please return.  If your symptoms are severe, please go to the emergency room.  Follow-up with your primary care provider for further evaluation and management of your symptoms  as well as ongoing wellness visits.  I hope you feel better!   ED Prescriptions     Medication Sig Dispense Auth. Provider   predniSONE (DELTASONE) 20 MG tablet Take 2 tablets (40 mg total) by mouth daily for 5 days. 10 tablet Talbot Grumbling, FNP   methocarbamol (ROBAXIN) 500 MG tablet Take 1 tablet (500 mg  total) by mouth 2 (two) times daily. 20 tablet Talbot Grumbling, FNP      PDMP not reviewed this encounter.   Talbot Grumbling, FNP 04/30/22 [place], [place], [place] 04/30/22 1042

## 2022-12-22 ENCOUNTER — Ambulatory Visit (INDEPENDENT_AMBULATORY_CARE_PROVIDER_SITE_OTHER): Payer: 59

## 2022-12-22 ENCOUNTER — Ambulatory Visit (HOSPITAL_COMMUNITY)
Admission: EM | Admit: 2022-12-22 | Discharge: 2022-12-22 | Disposition: A | Discharge status: Home / Self Care | Payer: 59 | Attending: Family Medicine | Admitting: Family Medicine

## 2022-12-22 ENCOUNTER — Encounter (HOSPITAL_COMMUNITY): Payer: Self-pay

## 2022-12-22 DIAGNOSIS — I771 Stricture of artery: Secondary | ICD-10-CM | POA: Diagnosis not present

## 2022-12-22 DIAGNOSIS — J40 Bronchitis, not specified as acute or chronic: Secondary | ICD-10-CM | POA: Diagnosis not present

## 2022-12-22 DIAGNOSIS — J0120 Acute ethmoidal sinusitis, unspecified: Secondary | ICD-10-CM | POA: Diagnosis not present

## 2022-12-22 DIAGNOSIS — R058 Other specified cough: Secondary | ICD-10-CM | POA: Diagnosis not present

## 2022-12-22 DIAGNOSIS — R509 Fever, unspecified: Secondary | ICD-10-CM | POA: Diagnosis not present

## 2022-12-22 DIAGNOSIS — R0602 Shortness of breath: Secondary | ICD-10-CM | POA: Diagnosis not present

## 2022-12-22 MED ORDER — ALBUTEROL SULFATE HFA 108 (90 BASE) MCG/ACT IN AERS: 2.0000 | INHALATION_SPRAY | RESPIRATORY_TRACT | 0 refills | Status: AC | PRN

## 2022-12-22 MED ORDER — BENZONATATE 200 MG PO CAPS: 200.0000 mg | ORAL_CAPSULE | Freq: Three times a day (TID) | ORAL | 0 refills | Status: DC | PRN

## 2022-12-22 MED ORDER — IPRATROPIUM-ALBUTEROL 0.5-2.5 (3) MG/3ML IN SOLN: 3.0000 mL | Freq: Once | RESPIRATORY_TRACT | Status: DC

## 2022-12-22 MED ORDER — METHYLPREDNISOLONE 4 MG PO TBPK: ORAL_TABLET | ORAL | 0 refills | Status: DC

## 2022-12-22 MED ORDER — DOXYCYCLINE HYCLATE 100 MG PO CAPS: 100.0000 mg | ORAL_CAPSULE | Freq: Two times a day (BID) | ORAL | 0 refills | Status: DC

## 2022-12-22 NOTE — Discharge Instructions (Addendum)
You were seen today for upper respiratory symptoms.  Your xray appears normal. I have sent out an antibiotic, cough medication and steroid to help with your symptoms.  If not improving or worsening then please return or go to the ER for evaluation.

## 2022-12-22 NOTE — ED Triage Notes (Addendum)
Pt c/o productive cough with thick yellow sputum and chest congestion x5 days. States taking OTC meds with little relief. States will need a work note for today and tomorrow.

## 2022-12-22 NOTE — ED Provider Notes (Signed)
[facility]    CSN: [id number] Arrival date & time: 12/22/22  1010      History   Chief Complaint Chief Complaint  Patient presents with   Cough    HPI Charles Garza is a 62 y.o. male.    Cough Associated symptoms: fever, rhinorrhea and sore throat    Patient is here for a cough/chest congestion, sinus congestion and drainage the last 2 weeks, worse the last 5 days.  He called out of work today.  Some wheezing/sob.  Having sweats.  Slight nausea, no vomiting.  H/o bronchitis, emphysema.   He is trying to quit smoking.  Having 1 cig/day currently.  No otc meds used.  Unsure of sick contacts.        Past Medical History:  Diagnosis Date   Sciatica     Patient Active Problem List   Diagnosis Date Noted   Abscess of right hand 10/24/2016   Cellulitis of hand 10/23/2016   Renal insufficiency, mild 10/23/2016   Cellulitis of right hand     Past Surgical History:  Procedure Laterality Date   INCISION AND DRAINAGE ABSCESS Right 10/23/2016   Procedure: INCISION AND DRAINAGE, RIGHT HAND;  Surgeon: Dairl Ponder, MD;  Location: [facility];  Service: Orthopedics;  Laterality: Right;       Home Medications    Prior to Admission medications   Medication Sig Start Date End Date Taking? Authorizing Provider  cetirizine (ZYRTEC ALLERGY) 10 MG tablet Take 1 tablet (10 mg total) by mouth daily. 07/08/20   Petrucelli, Samantha R, PA-C  gabapentin (NEURONTIN) 100 MG capsule Take 1 capsule (100 mg total) by mouth 3 (three) times daily. 01/26/22 02/25/22  Jeanelle Malling, PA  lidocaine (LIDODERM) 5 % Place 1 patch onto the skin daily as needed. Apply patch to area most significant pain once per day.  Remove and discard patch within 12 hours of application. 10/16/20   Petrucelli, Pleas Koch, PA-C  meloxicam (MOBIC) 15 MG tablet Take 1 tablet (15 mg total) by mouth daily. 02/07/22   Zenia Resides, MD  methocarbamol (ROBAXIN) 500 MG tablet Take 1 tablet (500 mg total) by  mouth 2 (two) times daily. 04/30/22   Carlisle Beers, FNP  senna-docusate (SENOKOT-S) 8.6-50 MG tablet Take 1 tablet by mouth at bedtime. Take while using narcotic pain medication (Norco/Vicodin) 10/24/16   Narda Bonds, MD    Family History Family History  Problem Relation Age of Onset   CAD Father    Lung cancer Sister     Social History Social History   Tobacco Use   Smoking status: Every Day    Types: Cigarettes   Smokeless tobacco: Current  Vaping Use   Vaping status: Never Used  Substance Use Topics   Alcohol use: No   Drug use: No     Allergies   Penicillins   Review of Systems Review of Systems  Constitutional:  Positive for fatigue and fever.  HENT:  Positive for congestion, rhinorrhea and sore throat.   Respiratory:  Positive for cough.   Cardiovascular: Negative.   Gastrointestinal: Negative.   Genitourinary: Negative.   Musculoskeletal: Negative.      Physical Exam Triage Vital Signs ED Triage Vitals [12/22/22 1047]  Encounter Vitals Group     BP (!) 188/93     Systolic BP Percentile      Diastolic BP Percentile      Pulse Rate 96     Resp 20     Temp  98.6 F (37 C)     Temp Source Oral     SpO2 95 %     Weight      Height      Head Circumference      Peak Flow      Pain Score 9     Pain Loc      Pain Education      Exclude from Growth Chart    No data found.  Updated Vital Signs BP (!) 188/93 (BP Location: Left Arm)   Pulse 96   Temp 98.6 F (37 C) (Oral)   Resp 20   SpO2 95%   Visual Acuity Right Eye Distance:   Left Eye Distance:   Bilateral Distance:    Right Eye Near:   Left Eye Near:    Bilateral Near:     Physical Exam Constitutional:      General: He is not in acute distress.    Appearance: Normal appearance. He is ill-appearing.  HENT:     Head:     Salivary Glands: Right salivary gland is tender. Left salivary gland is tender.     Nose: Congestion and rhinorrhea present.     Mouth/Throat:      Mouth: Mucous membranes are moist.  Cardiovascular:     Rate and Rhythm: Normal rate and regular rhythm.  Pulmonary:     Effort: Pulmonary effort is normal.     Breath sounds: Rhonchi present.     Comments: Coughing during the exam/with talking Musculoskeletal:     Cervical back: Normal range of motion and neck supple. Tenderness present.  Skin:    General: Skin is warm.  Neurological:     General: No focal deficit present.     Mental Status: He is alert.  Psychiatric:        Mood and Affect: Mood normal.      UC Treatments / Results  Labs (all labs ordered are listed, but only abnormal results are displayed) Labs Reviewed - No data to display  EKG   Radiology No results found.  Procedures Procedures (including critical care time)  Medications Ordered in UC Medications - No data to display   Initial Impression / Assessment and Plan / UC Course  I have reviewed the triage vital signs and the nursing notes.  Pertinent labs & imaging results that were available during my care of the patient were reviewed by me and considered in my medical decision making (see chart for details).   Final Clinical Impressions(s) / UC Diagnoses   Final diagnoses:  Acute non-recurrent ethmoidal sinusitis  Bronchitis     Discharge Instructions      You were seen today for upper respiratory symptoms.  Your xray appears normal. I have sent out an antibiotic, cough medication and steroid to help with your symptoms.  If not improving or worsening then please return or go to the ER for evaluation.     ED Prescriptions     Medication Sig Dispense Auth. Provider   methylPREDNISolone (MEDROL DOSEPAK) 4 MG TBPK tablet Take as directed 1 each Jearlean Demauro, MD   doxycycline (VIBRAMYCIN) 100 MG capsule Take 1 capsule (100 mg total) by mouth 2 (two) times daily. 20 capsule Isabele Lollar, MD   benzonatate (TESSALON) 200 MG capsule Take 1 capsule (200 mg total) by mouth 3 (three) times  daily as needed for cough. 21 capsule Hayven Fatima, MD   albuterol (VENTOLIN HFA) 108 (90 Base) MCG/ACT inhaler Inhale 2 puffs into  the lungs every 4 (four) hours as needed for wheezing or shortness of breath. 1 each Jannifer Franklin, MD      PDMP not reviewed this encounter.   Jannifer Franklin, MD 12/22/22 1153

## 2022-12-31 ENCOUNTER — Encounter (HOSPITAL_COMMUNITY): Payer: Self-pay

## 2022-12-31 ENCOUNTER — Ambulatory Visit (HOSPITAL_COMMUNITY)
Admission: EM | Admit: 2022-12-31 | Discharge: 2022-12-31 | Disposition: A | Discharge status: Home / Self Care | Payer: 59 | Attending: Nurse Practitioner | Admitting: Nurse Practitioner

## 2022-12-31 DIAGNOSIS — N489 Disorder of penis, unspecified: Secondary | ICD-10-CM | POA: Insufficient documentation

## 2022-12-31 MED ORDER — VALACYCLOVIR HCL 1 G PO TABS
1000.0000 mg | ORAL_TABLET | Freq: Two times a day (BID) | ORAL | 0 refills | Status: AC
Start: 2022-12-31 — End: 2023-01-05

## 2022-12-31 NOTE — ED Provider Notes (Signed)
[facility]    CSN: [id number] Arrival date & time: 12/31/22  1223      History   Chief Complaint Chief Complaint  Patient presents with   Groin Swelling    HPI Charles Garza is a 62 y.o. male.   Patient presents today for bumps and swelling to his penis ongoing for the past 4 days.  Denies fever, nausea/vomiting, penile discharge, abdominal or pelvic pain, and pain with urination.  Reports he was sexually active with a male partner 3 weeks ago.  No swelling in the groin or testicular pain.  No groin swelling.  Patient declines HIV and syphilis testing today.    Past Medical History:  Diagnosis Date   Sciatica     Patient Active Problem List   Diagnosis Date Noted   Abscess of right hand 10/24/2016   Cellulitis of hand 10/23/2016   Renal insufficiency, mild 10/23/2016   Cellulitis of right hand     Past Surgical History:  Procedure Laterality Date   INCISION AND DRAINAGE ABSCESS Right 10/23/2016   Procedure: INCISION AND DRAINAGE, RIGHT HAND;  Surgeon: Dairl Ponder, MD;  Location: [facility];  Service: Orthopedics;  Laterality: Right;       Home Medications    Prior to Admission medications   Medication Sig Start Date End Date Taking? Authorizing Provider  cetirizine (ZYRTEC ALLERGY) 10 MG tablet Take 1 tablet (10 mg total) by mouth daily. 07/08/20  Yes Petrucelli, Samantha R, PA-C  valACYclovir (VALTREX) 1000 MG tablet Take 1 tablet (1,000 mg total) by mouth 2 (two) times daily for 10 doses. 12/31/22 01/05/23 Yes Valentino Nose, NP  albuterol (VENTOLIN HFA) 108 (90 Base) MCG/ACT inhaler Inhale 2 puffs into the lungs every 4 (four) hours as needed for wheezing or shortness of breath. 12/22/22   Piontek, Denny Peon, MD  gabapentin (NEURONTIN) 100 MG capsule Take 1 capsule (100 mg total) by mouth 3 (three) times daily. 01/26/22 02/25/22  Jeanelle Malling, PA    Family History Family History  Problem Relation Age of Onset   CAD Father    Lung cancer Sister      Social History Social History   Tobacco Use   Smoking status: Every Day    Types: Cigarettes   Smokeless tobacco: Current  Vaping Use   Vaping status: Never Used  Substance Use Topics   Alcohol use: No   Drug use: No     Allergies   Penicillins   Review of Systems Review of Systems Per HPI  Physical Exam Triage Vital Signs ED Triage Vitals  Encounter Vitals Group     BP 12/31/22 1256 (!) 156/89     Systolic BP Percentile --      Diastolic BP Percentile --      Pulse Rate 12/31/22 1256 97     Resp 12/31/22 1256 16     Temp 12/31/22 1256 (!) 97.5 F (36.4 C)     Temp Source 12/31/22 1256 Oral     SpO2 12/31/22 1256 97 %     Weight --      Height --      Head Circumference --      Peak Flow --      Pain Score 12/31/22 1259 4     Pain Loc --      Pain Education --      Exclude from Growth Chart --    No data found.  Updated Vital Signs BP (!) 156/89 (BP Location: Left Arm)  Pulse 97   Temp (!) 97.5 F (36.4 C) (Oral)   Resp 16   SpO2 97%   Visual Acuity Right Eye Distance:   Left Eye Distance:   Bilateral Distance:    Right Eye Near:   Left Eye Near:    Bilateral Near:     Physical Exam Vitals and nursing note reviewed. Exam conducted with a chaperone present Duwayne Heck O'Garra NP Student).  Constitutional:      General: He is not in acute distress.    Appearance: Normal appearance. He is not toxic-appearing.  Pulmonary:     Effort: Pulmonary effort is normal. No respiratory distress.  Genitourinary:    Penis: Uncircumcised. Tenderness, swelling and lesions present. No discharge.      Testes:        Right: Mass, tenderness or swelling not present.        Left: Mass, tenderness or swelling not present.     Epididymis:     Right: Normal. Not inflamed or enlarged. No tenderness.     Left: Normal. Not inflamed or enlarged. No tenderness.  Lymphadenopathy:     Lower Body: No right inguinal adenopathy. No left inguinal adenopathy.  Skin:     General: Skin is warm and dry.     Capillary Refill: Capillary refill takes less than 2 seconds.     Coloration: Skin is not jaundiced or pale.  Neurological:     Mental Status: He is alert and oriented to person, place, and time.     Motor: No weakness.     Gait: Gait normal.  Psychiatric:        Behavior: Behavior is cooperative.      UC Treatments / Results  Labs (all labs ordered are listed, but only abnormal results are displayed) Labs Reviewed  HSV CULTURE AND TYPING  CYTOLOGY, (ORAL, ANAL, URETHRAL) ANCILLARY ONLY    EKG   Radiology No results found.  Procedures Procedures (including critical care time)  Medications Ordered in UC Medications - No data to display  Initial Impression / Assessment and Plan / UC Course  I have reviewed the triage vital signs and the nursing notes.  Pertinent labs & imaging results that were available during my care of the patient were reviewed by me and considered in my medical decision making (see chart for details).   Patient is well-appearing, normotensive, afebrile, not tachycardic, not tachypneic, oxygenating well on room air.    1. 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  All questions answered to their satisfaction.  The patient is in agreement to this plan.    Final Clinical Impressions(s) / UC Diagnoses   Final diagnoses:  Penile lesion     Discharge Instructions      Start taking the Valacyclovir to treat for possible herpes virus of your skin.  We will contact you if the test from today comes back positive for anything else.  Recommend condom use with every sexual encounter moving forward.    ED Prescriptions     Medication Sig Dispense Auth. Provider   valACYclovir (VALTREX)  1000 MG tablet Take 1 tablet (1,000 mg total) by mouth 2 (two) times daily for 10 doses. 10 tablet Valentino Nose, NP      PDMP not reviewed this encounter.  Valentino Nose, NP 12/31/22 [phone]

## 2022-12-31 NOTE — ED Triage Notes (Signed)
Pt c/o bumps and groin swelling that started this week. No known expose to STI.

## 2022-12-31 NOTE — Discharge Instructions (Signed)
Start taking the Valacyclovir to treat for possible herpes virus of your skin.  We will contact you if the test from today comes back positive for anything else.  Recommend condom use with every sexual encounter moving forward.

## 2023-01-02 LAB — CYTOLOGY, (ORAL, ANAL, URETHRAL) ANCILLARY ONLY
Chlamydia: NEGATIVE
Comment: NEGATIVE
Comment: NEGATIVE
Comment: NORMAL
Neisseria Gonorrhea: NEGATIVE
Trichomonas: NEGATIVE

## 2023-01-03 LAB — HSV CULTURE AND TYPING

## 2023-01-08 ENCOUNTER — Other Ambulatory Visit: Payer: Self-pay

## 2023-01-08 ENCOUNTER — Encounter (HOSPITAL_COMMUNITY): Payer: Self-pay | Admitting: Emergency Medicine

## 2023-01-08 ENCOUNTER — Ambulatory Visit (HOSPITAL_COMMUNITY)
Admission: EM | Admit: 2023-01-08 | Discharge: 2023-01-08 | Disposition: A | Discharge status: Home / Self Care | Payer: 59 | Attending: Internal Medicine | Admitting: Internal Medicine

## 2023-01-08 DIAGNOSIS — B3749 Other urogenital candidiasis: Secondary | ICD-10-CM | POA: Diagnosis not present

## 2023-01-08 MED ORDER — FLUCONAZOLE 150 MG PO TABS
150.0000 mg | ORAL_TABLET | Freq: Every day | ORAL | 0 refills | Status: AC
Start: 2023-01-08 — End: 2023-01-11

## 2023-01-08 MED ORDER — CLOTRIMAZOLE 1 % EX CREA
TOPICAL_CREAM | CUTANEOUS | 0 refills | Status: AC
Start: 2023-01-08 — End: ?

## 2023-01-08 NOTE — ED Triage Notes (Signed)
Patient states he was here last Saturday for a "virus of the skin" on his penis. He states he took the prescription as ordered but he is now having swelling and feels that it has gotten worse. He denies any intercourse since last Saturday. Rates pain at a 7/10, intermittently. States he now has a scab and a rash in the same area.

## 2023-01-08 NOTE — ED Provider Notes (Signed)
[facility]    CSN: [id number] Arrival date & time: 01/08/23  1028      History   Chief Complaint Chief Complaint  Patient presents with   Rash    Genital area    HPI Charles Garza is a 62 y.o. male.   62 year old male who presents to urgent care with complaints of continued rash on his penis.  He was here last week for this as well and was tested for STIs.  He did not hear anything from his results with however they were all negative.  He was given an antibiotic to take but has had no improvement in the symptoms, with them actually worsening slightly.  He denies any urinary symptoms.  He does have some whitish discharge from the skin under the foreskin.  He denies any fevers or chills   Rash Associated symptoms: no abdominal pain, no fever, no joint pain, no shortness of breath, no sore throat and not vomiting     Past Medical History:  Diagnosis Date   Sciatica     Patient Active Problem List   Diagnosis Date Noted   Abscess of right hand 10/24/2016   Cellulitis of hand 10/23/2016   Renal insufficiency, mild 10/23/2016   Cellulitis of right hand     Past Surgical History:  Procedure Laterality Date   INCISION AND DRAINAGE ABSCESS Right 10/23/2016   Procedure: INCISION AND DRAINAGE, RIGHT HAND;  Surgeon: Dairl Ponder, MD;  Location: [facility];  Service: Orthopedics;  Laterality: Right;       Home Medications    Prior to Admission medications   Medication Sig Start Date End Date Taking? Authorizing Provider  albuterol (VENTOLIN HFA) 108 (90 Base) MCG/ACT inhaler Inhale 2 puffs into the lungs every 4 (four) hours as needed for wheezing or shortness of breath. 12/22/22   Piontek, Denny Peon, MD  cetirizine (ZYRTEC ALLERGY) 10 MG tablet Take 1 tablet (10 mg total) by mouth daily. 07/08/20   Petrucelli, Samantha R, PA-C  gabapentin (NEURONTIN) 100 MG capsule Take 1 capsule (100 mg total) by mouth 3 (three) times daily. 01/26/22 02/25/22  Jeanelle Malling, PA    Family  History Family History  Problem Relation Age of Onset   CAD Father    Lung cancer Sister     Social History Social History   Tobacco Use   Smoking status: Every Day    Types: Cigarettes   Smokeless tobacco: Current  Vaping Use   Vaping status: Never Used  Substance Use Topics   Alcohol use: No   Drug use: No     Allergies   Penicillins   Review of Systems Review of Systems  Constitutional:  Negative for chills and fever.  HENT:  Negative for ear pain and sore throat.   Eyes:  Negative for pain and visual disturbance.  Respiratory:  Negative for cough and shortness of breath.   Cardiovascular:  Negative for chest pain and palpitations.  Gastrointestinal:  Negative for abdominal pain and vomiting.  Genitourinary:  Positive for genital sores and penile pain. Negative for difficulty urinating, dysuria, frequency, hematuria, penile discharge, penile swelling and testicular pain.  Musculoskeletal:  Negative for arthralgias and back pain.  Skin:  Positive for rash. Negative for color change.  Neurological:  Negative for seizures and syncope.  All other systems reviewed and are negative.    Physical Exam Triage Vital Signs ED Triage Vitals  Encounter Vitals Group     BP 01/08/23 1055 (!) 158/95  Systolic BP Percentile --      Diastolic BP Percentile --      Pulse Rate 01/08/23 1055 (!) 102     Resp 01/08/23 1055 14     Temp 01/08/23 1055 98.4 F (36.9 C)     Temp Source 01/08/23 1055 Oral     SpO2 01/08/23 1055 95 %     Weight --      Height --      Head Circumference --      Peak Flow --      Pain Score 01/08/23 1054 7     Pain Loc --      Pain Education --      Exclude from Growth Chart --    No data found.  Updated Vital Signs BP (!) 158/95 (BP Location: Right Arm)   Pulse (!) 102   Temp 98.4 F (36.9 C) (Oral)   Resp 14   SpO2 95%   Visual Acuity Right Eye Distance:   Left Eye Distance:   Bilateral Distance:    Right Eye Near:   Left Eye  Near:    Bilateral Near:     Physical Exam Vitals and nursing note reviewed.  Constitutional:      General: He is not in acute distress.    Appearance: He is well-developed.  HENT:     Head: Normocephalic and atraumatic.  Eyes:     Conjunctiva/sclera: Conjunctivae normal.  Cardiovascular:     Rate and Rhythm: Normal rate and regular rhythm.     Heart sounds: No murmur heard. Pulmonary:     Effort: Pulmonary effort is normal. No respiratory distress.     Breath sounds: Normal breath sounds.  Abdominal:     Palpations: Abdomen is soft.     Tenderness: There is no abdominal tenderness.  Genitourinary:   Musculoskeletal:        General: No swelling.     Cervical back: Neck supple.  Skin:    General: Skin is warm and dry.     Capillary Refill: Capillary refill takes less than 2 seconds.  Neurological:     Mental Status: He is alert.  Psychiatric:        Mood and Affect: Mood normal.      UC Treatments / Results  Labs (all labs ordered are listed, but only abnormal results are displayed) Labs Reviewed - No data to display  EKG   Radiology No results found.  Procedures Procedures (including critical care time)  Medications Ordered in UC Medications - No data to display  Initial Impression / Assessment and Plan / UC Course  I have reviewed the triage vital signs and the nursing notes.  Pertinent labs & imaging results that were available during my care of the patient were reviewed by me and considered in my medical decision making (see chart for details).     Yeast dermatitis of penis   Given the appearance and the results of the test last week were negative, this is likely a yeast infection. Will treat with Diflucan 150mg  daily for 3 days and clotrimazole topical to the tip of the penis and the skin under the foreskin twice daily for 2 weeks.   Return to urgent care or PCP if symptoms worsen or fail to resolve.   Final Clinical Impressions(s) / UC  Diagnoses   Final diagnoses:  None   Discharge Instructions   None    ED Prescriptions   None    PDMP not reviewed  this encounter.   Landis Martins, PA-C 01/08/23 1254

## 2023-01-08 NOTE — Discharge Instructions (Addendum)
Given the appearance and the results of the test last week were negative, this is likely a yeast infection. Will treat with Diflucan 150mg  daily for 3 days and clotrimazole topical to the tip of the penis and the skin under the foreskin twice daily for 2 weeks.   Return to urgent care or PCP if symptoms worsen or fail to resolve.

## 2023-01-18 ENCOUNTER — Ambulatory Visit (HOSPITAL_COMMUNITY)
Admission: EM | Admit: 2023-01-18 | Discharge: 2023-01-18 | Disposition: A | Discharge status: Home / Self Care | Payer: 59 | Attending: Physician Assistant | Admitting: Physician Assistant

## 2023-01-18 ENCOUNTER — Encounter (HOSPITAL_COMMUNITY): Payer: Self-pay

## 2023-01-18 DIAGNOSIS — M5441 Lumbago with sciatica, right side: Secondary | ICD-10-CM

## 2023-01-18 DIAGNOSIS — G8929 Other chronic pain: Secondary | ICD-10-CM | POA: Diagnosis not present

## 2023-01-18 DIAGNOSIS — M5431 Sciatica, right side: Secondary | ICD-10-CM

## 2023-01-18 DIAGNOSIS — M5442 Lumbago with sciatica, left side: Secondary | ICD-10-CM

## 2023-01-18 MED ORDER — PREDNISONE 20 MG PO TABS
40.0000 mg | ORAL_TABLET | Freq: Every day | ORAL | 0 refills | Status: AC
Start: 2023-01-18 — End: 2023-01-23

## 2023-01-18 MED ORDER — METHOCARBAMOL 500 MG PO TABS
500.0000 mg | ORAL_TABLET | Freq: Two times a day (BID) | ORAL | 0 refills | Status: AC
Start: 2023-01-18 — End: ?

## 2023-01-18 MED ORDER — LIDOCAINE 5 % EX PTCH: 1.0000 | MEDICATED_PATCH | CUTANEOUS | 0 refills | Status: AC

## 2023-01-18 NOTE — ED Triage Notes (Signed)
Pt c/o lower back pain that began this AM that radiates down BLE. Pt has hx of sciatic nerve pain; this feels similar to previous episodes.

## 2023-01-18 NOTE — Discharge Instructions (Signed)
I believe that you have injured the muscles in your back.  Start prednisone 40 mg for 4 days.  Do not take NSAIDs with this medication including aspirin, ibuprofen/Advil, naproxen/Aleve.  You can use acetaminophen/Tylenol for breakthrough pain.  Take Robaxin up to 2 times a day.  This will make you sleepy so do not drive or drink alcohol taking it.  Apply lidocaine patch for 12 hours during the day; remove this for 12 hours at night.  Use only 1 patch per 24 hours.  I recommend you follow-up with sports medicine; call to schedule an appointment.  If anything worsens and you have severe pain, going to the bathroom on yourself without noticing it, numbness or tingling in your legs, weakness in your legs you need to be seen immediately.

## 2023-01-18 NOTE — ED Provider Notes (Signed)
[facility]    CSN: [id number] Arrival date & time: 01/18/23  1052      History   Chief Complaint Chief Complaint  Patient presents with   Back Pain    HPI Charles Garza is a 62 y.o. male.   Patient presents today with a several hour history of recurrent lower back pain.  Reports he has a several year history of sciatica that intermittently is bothersome.  He denies any recent change in activity, recent fall, recent trauma before his symptoms began.  Reports that he was going to work and his symptoms began and suddenly worsened once he got to work.  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  He denies any previous surgery involving his back.  He was last seen by our clinic for the symptoms in February 2024 which when he was given prednisone and methocarbamol.  Reports that generally this does resolve his symptoms within a few days.  He denies any personal history of malignancy.  Denies history of diabetes.  He has not tried any over-the-counter medication for symptom management.  He denies any lower extremity weakness, saddle anesthesia.  Does report occasional numbness and paresthesias in his posterior legs that is equal on both sides but denies any saddle anesthesia.  He reports that symptoms are consistent with previous episodes of sciatica/lower back pain.    Past Medical History:  Diagnosis Date   Sciatica     Patient Active Problem List   Diagnosis Date Noted   Abscess of right hand 10/24/2016   Cellulitis of hand 10/23/2016   Renal insufficiency, mild 10/23/2016   Cellulitis of right hand     Past Surgical History:  Procedure Laterality Date   INCISION AND DRAINAGE ABSCESS Right 10/23/2016   Procedure: INCISION AND DRAINAGE, RIGHT HAND;  Surgeon: Dairl Ponder, MD;  Location: [facility];  Service: Orthopedics;  Laterality: Right;       Home Medications    Prior to Admission medications   Medication Sig Start Date End Date Taking?  Authorizing Provider  lidocaine (LIDODERM) 5 % Place 1 patch onto the skin daily. Remove & Discard patch within 12 hours or as directed by MD 01/18/23  Yes Cono Gebhard, Denny Peon K, PA-C  methocarbamol (ROBAXIN) 500 MG tablet Take 1 tablet (500 mg total) by mouth 2 (two) times daily. 01/18/23  Yes Pearson Reasons K, PA-C  predniSONE (DELTASONE) 20 MG tablet Take 2 tablets (40 mg total) by mouth daily for 5 days. 01/18/23 01/23/23 Yes Michaela Broski K, PA-C  albuterol (VENTOLIN HFA) 108 (90 Base) MCG/ACT inhaler Inhale 2 puffs into the lungs every 4 (four) hours as needed for wheezing or shortness of breath. 12/22/22   Piontek, Denny Peon, MD  cetirizine (ZYRTEC ALLERGY) 10 MG tablet Take 1 tablet (10 mg total) by mouth daily. 07/08/20   Petrucelli, Pleas Koch, PA-C  clotrimazole (LOTRIMIN) 1 % cream Apply to affected area 2 times daily 01/08/23   Doristine Mango A, PA-C  gabapentin (NEURONTIN) 100 MG capsule Take 1 capsule (100 mg total) by mouth 3 (three) times daily. 01/26/22 02/25/22  Jeanelle Malling, PA    Family History Family History  Problem Relation Age of Onset   CAD Father    Lung cancer Sister     Social History Social History   Tobacco Use   Smoking status: Every Day    Types: Cigarettes   Smokeless tobacco: Former  Building services engineer status: Never Used  Substance Use Topics  Alcohol use: No   Drug use: No     Allergies   Penicillins   Review of Systems Review of Systems  Constitutional:  Positive for activity change. Negative for appetite change, fatigue and fever.  Gastrointestinal:  Negative for abdominal pain, diarrhea, nausea and vomiting.  Genitourinary:  Negative for difficulty urinating and enuresis.  Musculoskeletal:  Positive for back pain. Negative for arthralgias and myalgias.  Neurological:  Positive for numbness (Numbness and tingling in both legs). Negative for weakness.     Physical Exam Triage Vital Signs ED Triage Vitals  Encounter Vitals Group     BP 01/18/23 1105  (!) 149/86     Systolic BP Percentile --      Diastolic BP Percentile --      Pulse Rate 01/18/23 1105 94     Resp 01/18/23 1105 18     Temp 01/18/23 1105 98 F (36.7 C)     Temp Source 01/18/23 1105 Oral     SpO2 01/18/23 1105 95 %     Weight --      Height --      Head Circumference --      Peak Flow --      Pain Score 01/18/23 1103 6     Pain Loc --      Pain Education --      Exclude from Growth Chart --    No data found.  Updated Vital Signs BP (!) 149/86 (BP Location: Left Arm)   Pulse 94   Temp 98 F (36.7 C) (Oral)   Resp 18   SpO2 95%   Visual Acuity Right Eye Distance:   Left Eye Distance:   Bilateral Distance:    Right Eye Near:   Left Eye Near:    Bilateral Near:     Physical Exam Vitals reviewed.  Constitutional:      General: He is awake.     Appearance: Normal appearance. He is well-developed. He is not ill-appearing.     Comments: Very pleasant male appears stated age in no acute distress sitting comfortably in exam room  HENT:     Head: Normocephalic and atraumatic.  Cardiovascular:     Rate and Rhythm: Normal rate and regular rhythm.     Heart sounds: Normal heart sounds, S1 normal and S2 normal. No murmur heard. Pulmonary:     Effort: Pulmonary effort is normal.     Breath sounds: Normal breath sounds. No stridor. No wheezing, rhonchi or rales.     Comments: Clear to auscultation bilaterally Musculoskeletal:     Cervical back: No tenderness or bony tenderness.     Thoracic back: Spasms and tenderness present. No bony tenderness.     Lumbar back: Spasms and tenderness present. No bony tenderness. Positive right straight leg raise test and positive left straight leg raise test.     Comments: Back: No pain percussion of vertebrae.  No deformity or step-off noted.  Significant tenderness palpation of lower thoracic and lumbar paraspinal muscles bilaterally.  Positive straight leg raise at approximately 40 degrees bilaterally.  Negative Faber.   Strength 5/5 bilateral lower extremities.  Neurological:     Mental Status: He is alert.  Psychiatric:        Behavior: Behavior is cooperative.      UC Treatments / Results  Labs (all labs ordered are listed, but only abnormal results are displayed) Labs Reviewed - No data to display  EKG   Radiology No results found.  Procedures  Procedures (including critical care time)  Medications Ordered in UC Medications - No data to display  Initial Impression / Assessment and Plan / UC Course  I have reviewed the triage vital signs and the nursing notes.  Pertinent labs & imaging results that were available during my care of the patient were reviewed by me and considered in my medical decision making (see chart for details).     Patient is well-appearing, afebrile, nontoxic, nontachycardic.  He denies any signs/symptoms of cauda equina so no indication for emergent evaluation or imaging.  Plain films were deferred as he denies any recent trauma and has no focal bony tenderness.  Suspect exacerbation of chronic sciatica.  Will treat with prednisone 40 mg daily and he was instructed not to take NSAIDs with this medication.  He was also given methocarbamol with instruction not to drive or drink alcohol taking this medication.  He can apply lidocaine patch to specific bothersome area but should only use this for 12 hours during the day and then remove it for 12 hours at night.  He is only to use 1 patch per 24 hours.  If his symptoms are not improving within a few days he is to return for reevaluation.  If he has any worsening or changing symptoms he needs to be seen immediately including bowel/bladder continence, lower extremity weakness, saddle anesthesia, increasing pain.  Strict return precautions given.  Work excuse note provided.  He can follow-up with sports medicine if symptoms or not improving was given contact information for local provider with instruction to call to schedule an  appointment as needed.  Final Clinical Impressions(s) / UC Diagnoses   Final diagnoses:  Chronic bilateral low back pain with bilateral sciatica  Bilateral sciatica     Discharge Instructions      I believe that you have injured the muscles in your back.  Start prednisone 40 mg for 4 days.  Do not take NSAIDs with this medication including aspirin, ibuprofen/Advil, naproxen/Aleve.  You can use acetaminophen/Tylenol for breakthrough pain.  Take Robaxin up to 2 times a day.  This will make you sleepy so do not drive or drink alcohol taking it.  Apply lidocaine patch for 12 hours during the day; remove this for 12 hours at night.  Use only 1 patch per 24 hours.  I recommend you follow-up with sports medicine; call to schedule an appointment.  If anything worsens and you have severe pain, going to the bathroom on yourself without noticing it, numbness or tingling in your legs, weakness in your legs you need to be seen immediately.      ED Prescriptions     Medication Sig Dispense Auth. Provider   predniSONE (DELTASONE) 20 MG tablet Take 2 tablets (40 mg total) by mouth daily for 5 days. 10 tablet Macallister Ashmead K, PA-C   methocarbamol (ROBAXIN) 500 MG tablet Take 1 tablet (500 mg total) by mouth 2 (two) times daily. 20 tablet Estephany Perot K, PA-C   lidocaine (LIDODERM) 5 % Place 1 patch onto the skin daily. Remove & Discard patch within 12 hours or as directed by MD 10 patch Chastin Riesgo, Noberto Retort, PA-C      PDMP not reviewed this encounter.   Jeani Hawking, PA-C 01/18/23 1124

## 2023-04-26 ENCOUNTER — Encounter (HOSPITAL_COMMUNITY): Payer: Self-pay | Admitting: Emergency Medicine

## 2023-04-26 ENCOUNTER — Ambulatory Visit (HOSPITAL_COMMUNITY)
Admission: EM | Admit: 2023-04-26 | Discharge: 2023-04-26 | Disposition: A | Discharge status: Home / Self Care | Payer: 59

## 2023-04-26 DIAGNOSIS — J305 Allergic rhinitis due to food: Secondary | ICD-10-CM

## 2023-04-26 MED ORDER — METHYLPREDNISOLONE 4 MG PO TBPK: ORAL_TABLET | ORAL | 0 refills | Status: AC

## 2023-04-26 NOTE — ED Provider Notes (Signed)
[facility]    CSN: [id number] Arrival date & time: 04/26/23  1643      History   Chief Complaint Chief Complaint  Patient presents with   Nasal Congestion    HPI Charles Garza is a 63 y.o. male who presents with severe allergy symptoms which are not improving with Claritin or Nose spray. 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 He thought he pulled back enough to avoid it, but the rhinitis, sneezing and nose congestion started right away. He did do saline nose rinses but has not helped. Has had to place tissue in his nostrils due to persistent rhinitis. He denies cough, aches or fever.     Past Medical History:  Diagnosis Date   Sciatica     Patient Active Problem List   Diagnosis Date Noted   Abscess of right hand 10/24/2016   Cellulitis of hand 10/23/2016   Renal insufficiency, mild 10/23/2016   Cellulitis of right hand     Past Surgical History:  Procedure Laterality Date   INCISION AND DRAINAGE ABSCESS Right 10/23/2016   Procedure: INCISION AND DRAINAGE, RIGHT HAND;  Surgeon: Dairl Ponder, MD;  Location: [facility];  Service: Orthopedics;  Laterality: Right;       Home Medications    Prior to Admission medications   Medication Sig Start Date End Date Taking? Authorizing Provider  CLONIDINE HCL PO Take by mouth.   Yes [provider]  methylPREDNISolone (MEDROL DOSEPAK) 4 MG TBPK tablet Take as directed 04/26/23  Yes Rodriguez-Southworth, Nettie Elm, PA-C  albuterol (VENTOLIN HFA) 108 (90 Base) MCG/ACT inhaler Inhale 2 puffs into the lungs every 4 (four) hours as needed for wheezing or shortness of breath. 12/22/22  Yes Piontek, Denny Peon, MD  cetirizine (ZYRTEC ALLERGY) 10 MG tablet Take 1 tablet (10 mg total) by mouth daily. 07/08/20  Yes Petrucelli, Samantha R, PA-C  clotrimazole (LOTRIMIN) 1 % cream Apply to affected area 2 times daily 01/08/23   Doristine Mango A, PA-C  gabapentin (NEURONTIN) 100 MG  capsule Take 1 capsule (100 mg total) by mouth 3 (three) times daily. 01/26/22 02/25/22  Jeanelle Malling, PA  lidocaine (LIDODERM) 5 % Place 1 patch onto the skin daily. Remove & Discard patch within 12 hours or as directed by MD 01/18/23   Raspet, Noberto Retort, PA-C  methocarbamol (ROBAXIN) 500 MG tablet Take 1 tablet (500 mg total) by mouth 2 (two) times daily. 01/18/23   Raspet, Noberto Retort, PA-C    Family History Family History  Problem Relation Age of Onset   CAD Father    Lung cancer Sister     Social History Social History   Tobacco Use   Smoking status: Some Days    Types: Cigars   Smokeless tobacco: Former  Building services engineer status: Never Used  Substance Use Topics   Alcohol use: No   Drug use: No     Allergies   Penicillins   Review of Systems Review of Systems  As noted in HPI Physical Exam Triage Vital Signs ED Triage Vitals  Encounter Vitals Group     BP 04/26/23 1851 (!) 159/92     Systolic BP Percentile --      Diastolic BP Percentile --      Pulse Rate 04/26/23 1851 87     Resp 04/26/23 1851 18     Temp 04/26/23 1851 98.2 F (36.8 C)     Temp Source 04/26/23 1851  Oral     SpO2 04/26/23 1851 97 %     Weight 04/26/23 1852 173 lb (78.5 kg)     Height 04/26/23 1852 5\' 6"  (1.676 m)     Head Circumference --      Peak Flow --      Pain Score 04/26/23 1852 9     Pain Loc --      Pain Education --      Exclude from Growth Chart --    No data found.  Updated Vital Signs BP (!) 159/92 (BP Location: Left Arm)   Pulse 87   Temp 98.2 F (36.8 C) (Oral)   Resp 18   Ht 5\' 6"  (1.676 m)   Wt 173 lb (78.5 kg)   SpO2 97%   BMI 27.92 kg/m   Visual Acuity Right Eye Distance:   Left Eye Distance:   Bilateral Distance:    Right Eye Near:   Left Eye Near:    Bilateral Near:     Physical Exam Alert pt NAD who seems nasally congested EYES- non icterus, mild watering, no purulent drainage NOSE- moderate mucosa congestion which is pale pink with clear mucous. No  sinus tenderness TM- both gray and little dull, canals are normal PHARYNX- clear, clear drainage noted.  NECK- supple with no nodes SKIN- non jaundiced, no rashes.    UC Treatments / Results  Labs (all labs ordered are listed, but only abnormal results are displayed) Labs Reviewed - No data to display  EKG   Radiology No results found.  Procedures Procedures (including critical care time)  Medications Ordered in UC Medications - No data to display  Initial Impression / Assessment and Plan / UC Course  I have reviewed the triage vital signs and the nursing notes.  Severe allergic rhinitis  I placed him on Medrol dose pack as noted.      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 Provider   methylPREDNISolone (MEDROL DOSEPAK) 4 MG TBPK tablet Take as directed 21 tablet Rodriguez-Southworth, Nettie Elm, PA-C      PDMP not reviewed this encounter.   Garey Ham, [place] 04/26/23 1906

## 2023-04-26 NOTE — ED Triage Notes (Signed)
Patient states that he has really bad allergies, nasal drainage, head pressure, sinus headache.  Patient was at work and had a mixture Cajun and Rotisserie seasoning was blown in his face.  Patient has used an OTC nasal spray, Claritin.
